# Patient Record
Sex: Male | Born: 1960 | Race: Black or African American | Hispanic: No | Marital: Single | Smoking: Current every day smoker
Health system: Southern US, Community
[De-identification: ages and names within clinical notes are randomized; demographics above are authoritative.]

## PROBLEM LIST (undated history)

## (undated) DIAGNOSIS — M1990 Unspecified osteoarthritis, unspecified site: Secondary | ICD-10-CM

## (undated) DIAGNOSIS — Z9109 Other allergy status, other than to drugs and biological substances: Secondary | ICD-10-CM

## (undated) DIAGNOSIS — M109 Gout, unspecified: Secondary | ICD-10-CM

## (undated) DIAGNOSIS — E7800 Pure hypercholesterolemia, unspecified: Secondary | ICD-10-CM

## (undated) DIAGNOSIS — I10 Essential (primary) hypertension: Secondary | ICD-10-CM

## (undated) HISTORY — PX: NECK SURGERY: SHX720

---

## 2014-08-04 DIAGNOSIS — I10 Essential (primary) hypertension: Secondary | ICD-10-CM | POA: Insufficient documentation

## 2016-06-05 ENCOUNTER — Ambulatory Visit: Payer: No Typology Code available for payment source | Attending: Chiropractic Medicine | Admitting: Physical Therapy

## 2017-03-17 ENCOUNTER — Emergency Department (HOSPITAL_BASED_OUTPATIENT_CLINIC_OR_DEPARTMENT_OTHER): Payer: Self-pay

## 2017-03-17 ENCOUNTER — Encounter (HOSPITAL_BASED_OUTPATIENT_CLINIC_OR_DEPARTMENT_OTHER): Payer: Self-pay | Admitting: Emergency Medicine

## 2017-03-17 ENCOUNTER — Emergency Department (HOSPITAL_BASED_OUTPATIENT_CLINIC_OR_DEPARTMENT_OTHER)
Admission: EM | Admit: 2017-03-17 | Discharge: 2017-03-17 | Discharge status: Against Medical Advice | Payer: Self-pay | Attending: Emergency Medicine | Admitting: Emergency Medicine

## 2017-03-17 ENCOUNTER — Other Ambulatory Visit: Payer: Self-pay

## 2017-03-17 DIAGNOSIS — R531 Weakness: Secondary | ICD-10-CM | POA: Insufficient documentation

## 2017-03-17 DIAGNOSIS — R202 Paresthesia of skin: Secondary | ICD-10-CM | POA: Insufficient documentation

## 2017-03-17 DIAGNOSIS — R935 Abnormal findings on diagnostic imaging of other abdominal regions, including retroperitoneum: Secondary | ICD-10-CM | POA: Insufficient documentation

## 2017-03-17 DIAGNOSIS — F17200 Nicotine dependence, unspecified, uncomplicated: Secondary | ICD-10-CM | POA: Insufficient documentation

## 2017-03-17 DIAGNOSIS — R42 Dizziness and giddiness: Secondary | ICD-10-CM | POA: Insufficient documentation

## 2017-03-17 DIAGNOSIS — R29898 Other symptoms and signs involving the musculoskeletal system: Secondary | ICD-10-CM

## 2017-03-17 DIAGNOSIS — R079 Chest pain, unspecified: Secondary | ICD-10-CM | POA: Insufficient documentation

## 2017-03-17 DIAGNOSIS — R0602 Shortness of breath: Secondary | ICD-10-CM | POA: Insufficient documentation

## 2017-03-17 DIAGNOSIS — I10 Essential (primary) hypertension: Secondary | ICD-10-CM | POA: Insufficient documentation

## 2017-03-17 HISTORY — DX: Other allergy status, other than to drugs and biological substances: Z91.09

## 2017-03-17 HISTORY — DX: Pure hypercholesterolemia, unspecified: E78.00

## 2017-03-17 HISTORY — DX: Essential (primary) hypertension: I10

## 2017-03-17 LAB — CBC
HEMATOCRIT: 44.7 % (ref 39.0–52.0)
Hemoglobin: 16.2 g/dL (ref 13.0–17.0)
MCH: 31.4 pg (ref 26.0–34.0)
MCHC: 36.2 g/dL — AB (ref 30.0–36.0)
MCV: 86.6 fL (ref 78.0–100.0)
Platelets: 231 10*3/uL (ref 150–400)
RBC: 5.16 MIL/uL (ref 4.22–5.81)
RDW: 12.8 % (ref 11.5–15.5)
WBC: 6.9 10*3/uL (ref 4.0–10.5)

## 2017-03-17 LAB — BASIC METABOLIC PANEL
Anion gap: 11 (ref 5–15)
BUN: 16 mg/dL (ref 6–20)
CALCIUM: 9.1 mg/dL (ref 8.9–10.3)
CO2: 24 mmol/L (ref 22–32)
CREATININE: 0.94 mg/dL (ref 0.61–1.24)
Chloride: 99 mmol/L — ABNORMAL LOW (ref 101–111)
GFR calc non Af Amer: >60 mL/min (ref >60)
Glucose, Bld: 116 mg/dL — ABNORMAL HIGH (ref 65–99)
Potassium: 3.5 mmol/L (ref 3.5–5.1)
Sodium: 134 mmol/L — ABNORMAL LOW (ref 135–145)

## 2017-03-17 LAB — URINALYSIS, ROUTINE W REFLEX MICROSCOPIC
Bilirubin Urine: NEGATIVE
GLUCOSE, UA: NEGATIVE mg/dL
HGB URINE DIPSTICK: NEGATIVE
Ketones, ur: NEGATIVE mg/dL
Leukocytes, UA: NEGATIVE
Nitrite: NEGATIVE
PH: 5.5 (ref 5.0–8.0)
Protein, ur: NEGATIVE mg/dL
SPECIFIC GRAVITY, URINE: 1.007 (ref 1.005–1.030)

## 2017-03-17 LAB — LIPASE, BLOOD: LIPASE: 22 U/L (ref 11–51)

## 2017-03-17 LAB — TROPONIN I: Troponin I: <0.03 ng/mL (ref <0.03)

## 2017-03-17 MED ORDER — IOPAMIDOL (ISOVUE-370) INJECTION 76%
100.0000 mL | Freq: Once | INTRAVENOUS | Status: AC | PRN
  Administered 2017-03-17: 100 mL via INTRAVENOUS

## 2017-03-17 NOTE — ED Triage Notes (Signed)
Chest pain for past 3-4 days with right arm numbness. Pain comes and goes,denies at present. Daily ETOH drinker and has recent death of nephew on the 03-17-2023. Lots of stress

## 2017-03-17 NOTE — ED Provider Notes (Signed)
[facility]   CSN: [id number] Arrival date & time: 03/17/17  1225     History   Chief Complaint Chief Complaint  Patient presents with  . Chest Pain    HPI Edwin Odonnell is a 56 y.o. male with history of hypertension, hypercholesterolemia, alcohol abuse who presents with a 3-4 day history of intermittent central, sharp chest pain with some associated shortness of breath and intermittent lightheadedness. Patient has also had right sided paresthesias to his right upper extremity. Patient denies any recent long trips, surgeries, cancer, history of blood clot. Patient does report an increased tightness, soreness to his left calf that has been present for the same amount of time as his other symptoms. Patient has not taken any medications at home for his symptoms. Patient recently had a death in the family and has been under a lot of stress over the past 5 days. Patient does not have any history of anxiety. Patient has history of hypertension, hypercholesterolemia. Patient reports that he drinks "a few 40s and more than a few shots of liquor" daily.  HPI  Past Medical History:  Diagnosis Date  . Environmental allergies   . High cholesterol   . High cholesterol   . Hypertension     There are no active problems to display for this patient.   Past Surgical History:  Procedure Laterality Date  . NECK SURGERY     ? gland removed from right side of neck at age 62        Home Medications    Prior to Admission medications   Not on File    Family History No family history on file.  Social History Social History  Substance Use Topics  . Smoking status: Current Every Day Smoker    Packs/day: 1.00  . Smokeless tobacco: Never Used  . Alcohol use Yes     Comment: daily     Allergies   Patient has no known allergies.   Review of Systems Review of Systems  Constitutional: Negative for chills and fever.  HENT: Negative for facial swelling and  sore throat.   Respiratory: Positive for shortness of breath.   Cardiovascular: Positive for chest pain.  Gastrointestinal: Negative for abdominal pain, nausea and vomiting.  Genitourinary: Negative for dysuria.  Musculoskeletal: Negative for back pain.  Skin: Negative for rash and wound.  Neurological: Positive for weakness (RUE), light-headedness and numbness (paresthesia to RUE). Negative for headaches.  Psychiatric/Behavioral: The patient is not nervous/anxious.      Physical Exam Updated Vital Signs BP (!) 141/89   Pulse 85   Temp 98.3 F (36.8 C) (Oral)   Resp 17   Ht  (1.854 m)   Wt 90.7 kg   SpO2 97%   BMI 26.39 kg/m   Physical Exam  Constitutional: He appears well-developed and well-nourished. No distress.  HENT:  Head: Normocephalic and atraumatic.  Mouth/Throat: Oropharynx is clear and moist. No oropharyngeal exudate.  Eyes: Conjunctivae are normal. Pupils are equal, round, and reactive to light. Right eye exhibits no discharge. Left eye exhibits no discharge. No scleral icterus.  Neck: Normal range of motion. Neck supple. No thyromegaly present.  Cardiovascular: Normal rate, regular rhythm, normal heart sounds and intact distal pulses.  Exam reveals no gallop and no friction rub.   No murmur heard. Pulmonary/Chest: Effort normal and breath sounds normal. No stridor. No respiratory distress. He has no wheezes. He has no rales.  Abdominal: Soft. Bowel sounds are normal. He exhibits  no distension. There is no tenderness. There is no rebound and no guarding.  Musculoskeletal: He exhibits no edema.  Right sided weakness with flexion and extension of upper extremity, notable decreased strength in the right side, radial pulses intact; 5/5 strength to lower extremity, mild right calf tenderness; DP pulses intact  Lymphadenopathy:    He has no cervical adenopathy.  Neurological: He is alert. Coordination normal.  Skin: Skin is warm and dry. No rash noted. He is not  diaphoretic. No pallor.  Psychiatric: He has a normal mood and affect.  Nursing note and vitals reviewed.    ED Treatments / Results  Labs (all labs ordered are listed, but only abnormal results are displayed) Labs Reviewed  CBC - Abnormal; Notable for the following:       Result Value   MCHC 36.2 (*)    All other components within normal limits  BASIC METABOLIC PANEL - Abnormal; Notable for the following:    Sodium 134 (*)    Chloride 99 (*)    Glucose, Bld 116 (*)    All other components within normal limits  TROPONIN I  LIPASE, BLOOD  URINALYSIS, ROUTINE W REFLEX MICROSCOPIC  TROPONIN I    EKG  EKG Interpretation  Date/Time:  Saturday March 17 2017 12:38:21 EDT Ventricular Rate:  102 PR Interval:  140 QRS Duration: 96 QT Interval:  362 QTC Calculation: 471 R Axis:   52 Text Interpretation:  Sinus tachycardia Otherwise normal ECG No old tracing to compare Confirmed by [facility]  MD, MARTHA [id number]) on 03/17/2017 1:30:05 PM       Radiology Ct Head Wo Contrast  Result Date: 03/17/2017 CLINICAL DATA:  Right upper extremity weakness and numbness 3-4 days. Chest pain. ETOH. EXAM: CT HEAD WITHOUT CONTRAST TECHNIQUE: Contiguous axial images were obtained from the base of the skull through the vertex without intravenous contrast. COMPARISON:  None. FINDINGS: Brain: Ventricles, cisterns and other CSF spaces are within normal. There is no mass, mass effect, shift of midline structures or acute hemorrhage. No evidence of acute infarction. Vascular: Within normal. Skull: Normal. Sinuses/Orbits: Normal. Other: None. IMPRESSION: No acute intracranial findings. Electronically Signed   By: Elberta Fortis M.D.   On: 03/17/2017 14:40   Ct Angio Chest Aorta W And/or Wo Contrast  Result Date: 03/17/2017 CLINICAL DATA:  Right upper extremity numbness and weakness 3- 4 days with chest pain. Hypertension. EXAM: CT ANGIOGRAPHY CHEST, ABDOMEN AND PELVIS TECHNIQUE: Multidetector CT imaging through the  chest, abdomen and pelvis was performed using the standard protocol during bolus administration of intravenous contrast. Multiplanar reconstructed images and MIPs were obtained and reviewed to evaluate the vascular anatomy. CONTRAST:  100 mL Isovue 370 IV. COMPARISON:  None. FINDINGS: CTA CHEST FINDINGS Cardiovascular: Heart is normal in size. Minimal calcified plaque over the proximal left anterior descending coronary artery. Thoracic aorta is normal in caliber without evidence of aneurysm or dissection. Common takeoff of the right brachiocephalic and left common carotid arteries from the arch. Remaining vascular structures are within normal. Mediastinum/Nodes: No evidence of mediastinal or hilar adenopathy. Remaining mediastinal structures are normal. Lungs/Pleura: Lungs are well inflated without consolidation or effusion. There is a 6 mm sub solid nodular density over the left apex as well as a 2-3 mm sub solid nodule over the right middle lobe. Airways are normal. Musculoskeletal: Within normal. Review of the MIP images confirms the above findings. CTA ABDOMEN AND PELVIS FINDINGS VASCULAR Aorta: Within normal without evidence of aneurysm or dissection. Celiac: Normal. SMA:  Normal. Renals: Normal. IMA: Normal. Inflow: Normal. Veins: Normal. Review of the MIP images confirms the above findings. NON-VASCULAR Hepatobiliary: Normal. Pancreas: Normal. Spleen: Normal. Adrenals/Urinary Tract: Adrenal glands are normal. Kidneys are normal in size without hydronephrosis or nephrolithiasis. Two small subcentimeter hypodensities over the upper pole left renal cortex too small to characterize but likely cysts. Ureters and bladder are normal. Stomach/Bowel: The stomach and small bowel are within normal. Appendix is normal. Colon is normal. Lymphatic: No adenopathy. Reproductive: Prominent prostate gland measuring 5.3 x 5.8 x 6.6 cm with prominent impression upon the bladder base. Other: No free fluid or focal inflammatory  change. Small umbilical hernia containing only peritoneal fat. Musculoskeletal: Within normal Review of the MIP images confirms the above findings. IMPRESSION: Normal thoracoabdominal aorta without evidence of aneurysm or dissection. No acute findings in the chest, abdomen or pelvis. Two small sub solid nodules with the larger over the left apex measuring 6 mm. Recommend followup CT 6 months. This recommendation follows the consensus statement: Guidelines for Management of Small Pulmonary Nodules Detected on CT Scans: A Statement from the Fleischner Society as published in Radiology 2005; 237:395-400. Online at: DietDisorder.cz. Subtle atherosclerotic coronary artery disease involving the left anterior descending coronary artery. Two small left upper pole renal cortical hypodensities likely cysts but too small to characterize. Mild prostatic enlargement measuring 5.3 x 5.8 x 6.6 cm with impression upon the bladder base. Small umbilical hernia containing only peritoneal fat. Electronically Signed   By: Elberta Fortis M.D.   On: 03/17/2017 15:01   Ct Angio Abd/pel W And/or Wo Contrast  Result Date: 03/17/2017 CLINICAL DATA:  Right upper extremity numbness and weakness 3- 4 days with chest pain. Hypertension. EXAM: CT ANGIOGRAPHY CHEST, ABDOMEN AND PELVIS TECHNIQUE: Multidetector CT imaging through the chest, abdomen and pelvis was performed using the standard protocol during bolus administration of intravenous contrast. Multiplanar reconstructed images and MIPs were obtained and reviewed to evaluate the vascular anatomy. CONTRAST:  100 mL Isovue 370 IV. COMPARISON:  None. FINDINGS: CTA CHEST FINDINGS Cardiovascular: Heart is normal in size. Minimal calcified plaque over the proximal left anterior descending coronary artery. Thoracic aorta is normal in caliber without evidence of aneurysm or dissection. Common takeoff of the right brachiocephalic and left common carotid arteries  from the arch. Remaining vascular structures are within normal. Mediastinum/Nodes: No evidence of mediastinal or hilar adenopathy. Remaining mediastinal structures are normal. Lungs/Pleura: Lungs are well inflated without consolidation or effusion. There is a 6 mm sub solid nodular density over the left apex as well as a 2-3 mm sub solid nodule over the right middle lobe. Airways are normal. Musculoskeletal: Within normal. Review of the MIP images confirms the above findings. CTA ABDOMEN AND PELVIS FINDINGS VASCULAR Aorta: Within normal without evidence of aneurysm or dissection. Celiac: Normal. SMA: Normal. Renals: Normal. IMA: Normal. Inflow: Normal. Veins: Normal. Review of the MIP images confirms the above findings. NON-VASCULAR Hepatobiliary: Normal. Pancreas: Normal. Spleen: Normal. Adrenals/Urinary Tract: Adrenal glands are normal. Kidneys are normal in size without hydronephrosis or nephrolithiasis. Two small subcentimeter hypodensities over the upper pole left renal cortex too small to characterize but likely cysts. Ureters and bladder are normal. Stomach/Bowel: The stomach and small bowel are within normal. Appendix is normal. Colon is normal. Lymphatic: No adenopathy. Reproductive: Prominent prostate gland measuring 5.3 x 5.8 x 6.6 cm with prominent impression upon the bladder base. Other: No free fluid or focal inflammatory change. Small umbilical hernia containing only peritoneal fat. Musculoskeletal: Within normal Review of the  MIP images confirms the above findings. IMPRESSION: Normal thoracoabdominal aorta without evidence of aneurysm or dissection. No acute findings in the chest, abdomen or pelvis. Two small sub solid nodules with the larger over the left apex measuring 6 mm. Recommend followup CT 6 months. This recommendation follows the consensus statement: Guidelines for Management of Small Pulmonary Nodules Detected on CT Scans: A Statement from the Fleischner Society as published in Radiology  2005; 237:395-400. Online at: DietDisorder.cz. Subtle atherosclerotic coronary artery disease involving the left anterior descending coronary artery. Two small left upper pole renal cortical hypodensities likely cysts but too small to characterize. Mild prostatic enlargement measuring 5.3 x 5.8 x 6.6 cm with impression upon the bladder base. Small umbilical hernia containing only peritoneal fat. Electronically Signed   By: Elberta Fortis M.D.   On: 03/17/2017 15:01    Procedures Procedures (including critical care time)  Medications Ordered in ED Medications  iopamidol (ISOVUE-370) 76 % injection 100 mL (100 mLs Intravenous Contrast Given 03/17/17 1404)     Initial Impression / Assessment and Plan / ED Course  I have reviewed the triage vital signs and the nursing notes.  Pertinent labs & imaging results that were available during my care of the patient were reviewed by me and considered in my medical decision making (see chart for details).     Patient left AGAINST MEDICAL ADVICE because he did not have time to stay any longer. CBC unremarkable. BMP shows sodium 134, chloride 99, glucose 116. Initial troponin negative. UA negative. EKG shows sinus tachycardia, but otherwise normal. CT dissection study showed normal aorta without evidence of aneurysm or dissection or other acute findings in the chest, abdomen, or pelvis. Patient also had 2 small sub-solid nodules in the lung recommending follow-up CT in 6 months, I wasn't able to make patient aware because he left AMA. Patient hard score 4. I recommended hospitalist admission or at the very least a delta troponin and consulting neurology considering patient's neurodeficit, however patient wanted to leave immediately. I advised patient to follow-up with cardiology and neurology, but patient did not wait for discharge paperwork. I made patient aware of the risks of leaving AGAINST MEDICAL ADVICE, including death.  He understands. Patient began to walk out with an IV in his arm, however he was stopped by nursing staff and had IV removed prior to discharge. I discussed patient case with Dr. Karma Ganja who guided the patient's management and agrees with plan.   Final Clinical Impressions(s) / ED Diagnoses   Final diagnoses:  Chest pain, unspecified type  Paresthesia of right arm  Right arm weakness    New Prescriptions There are no discharge medications for this patient.    Emi Holes, PA-C 03/17/17 1547    Jerelyn Scott, MD 03/17/17 [phone]

## 2017-03-17 NOTE — ED Notes (Signed)
Denies chest pain at present and numbness to right arm x 4 days . States that nephew just died and has been under a lot of stress

## 2017-03-17 NOTE — ED Notes (Signed)
Patient left AMA.  Patient was stopped walking out and stated that he was in a hurry and had to leave.  IV was removed and the patient left.

## 2017-03-17 NOTE — ED Notes (Signed)
RN starting IV and collecting blood work 

## 2019-02-11 ENCOUNTER — Other Ambulatory Visit: Payer: Self-pay

## 2019-02-11 ENCOUNTER — Encounter (HOSPITAL_BASED_OUTPATIENT_CLINIC_OR_DEPARTMENT_OTHER): Payer: Self-pay

## 2019-02-11 ENCOUNTER — Emergency Department (HOSPITAL_BASED_OUTPATIENT_CLINIC_OR_DEPARTMENT_OTHER)
Admission: EM | Admit: 2019-02-11 | Discharge: 2019-02-11 | Disposition: A | Discharge status: Home / Self Care | Payer: Self-pay | Attending: Emergency Medicine | Admitting: Emergency Medicine

## 2019-02-11 DIAGNOSIS — I10 Essential (primary) hypertension: Secondary | ICD-10-CM | POA: Insufficient documentation

## 2019-02-11 DIAGNOSIS — F17210 Nicotine dependence, cigarettes, uncomplicated: Secondary | ICD-10-CM | POA: Insufficient documentation

## 2019-02-11 HISTORY — DX: Unspecified osteoarthritis, unspecified site: M19.90

## 2019-02-11 LAB — URINALYSIS, ROUTINE W REFLEX MICROSCOPIC
BILIRUBIN URINE: NEGATIVE
Glucose, UA: NEGATIVE mg/dL
HGB URINE DIPSTICK: NEGATIVE
Ketones, ur: NEGATIVE mg/dL
Leukocytes,Ua: NEGATIVE
NITRITE: NEGATIVE
Protein, ur: NEGATIVE mg/dL
pH: 5 (ref 5.0–8.0)

## 2019-02-11 LAB — CBC WITH DIFFERENTIAL/PLATELET
Abs Immature Granulocytes: 0.03 10*3/uL (ref 0.00–0.07)
BASOS ABS: 0 10*3/uL (ref 0.0–0.1)
Basophils Relative: 0 %
Eosinophils Absolute: 0.1 10*3/uL (ref 0.0–0.5)
Eosinophils Relative: 1 %
HEMATOCRIT: 45.1 % (ref 39.0–52.0)
HEMOGLOBIN: 15.5 g/dL (ref 13.0–17.0)
Immature Granulocytes: 0 %
LYMPHS ABS: 1.6 10*3/uL (ref 0.7–4.0)
LYMPHS PCT: 22 %
MCH: 32.1 pg (ref 26.0–34.0)
MCHC: 34.4 g/dL (ref 30.0–36.0)
MCV: 93.4 fL (ref 80.0–100.0)
Monocytes Absolute: 0.7 10*3/uL (ref 0.1–1.0)
Monocytes Relative: 9 %
NEUTROS PCT: 68 %
NRBC: 0 % (ref 0.0–0.2)
Neutro Abs: 4.9 10*3/uL (ref 1.7–7.7)
Platelets: 236 10*3/uL (ref 150–400)
RBC: 4.83 MIL/uL (ref 4.22–5.81)
RDW: 12.7 % (ref 11.5–15.5)
WBC: 7.2 10*3/uL (ref 4.0–10.5)

## 2019-02-11 LAB — BASIC METABOLIC PANEL
ANION GAP: 8 (ref 5–15)
BUN: 17 mg/dL (ref 6–20)
CALCIUM: 9 mg/dL (ref 8.9–10.3)
CHLORIDE: 107 mmol/L (ref 98–111)
CO2: 21 mmol/L — AB (ref 22–32)
Creatinine, Ser: 0.96 mg/dL (ref 0.61–1.24)
GFR calc non Af Amer: >60 mL/min (ref >60)
Glucose, Bld: 88 mg/dL (ref 70–99)
Potassium: 4 mmol/L (ref 3.5–5.1)
SODIUM: 136 mmol/L (ref 135–145)

## 2019-02-11 LAB — TROPONIN I: Troponin I: <0.03 ng/mL (ref <0.03)

## 2019-02-11 MED ORDER — AMLODIPINE BESYLATE 5 MG PO TABS
5.0000 mg | ORAL_TABLET | Freq: Once | ORAL | Status: AC
  Administered 2019-02-11: 5 mg via ORAL
  Filled 2019-02-11: qty 1

## 2019-02-11 MED ORDER — AMLODIPINE BESYLATE 5 MG PO TABS: 5.0000 mg | ORAL_TABLET | Freq: Every day | ORAL | 0 refills | Status: DC

## 2019-02-11 NOTE — ED Triage Notes (Addendum)
C/o dizziness, blurred vision-pain to posterior neck and right shoulder x 3-4 days-pt NAD-steady gait

## 2019-02-11 NOTE — ED Provider Notes (Signed)
[facility] EMERGENCY DEPARTMENT Provider Note   CSN: [id number] Arrival date & time: 02/11/19  1247    History   Chief Complaint Chief Complaint  Patient presents with  . Dizziness    HPI Edwin Odonnell is a 58 y.o. male.     HPI   Date-year-old male, with a PMH of HTN, HLD, daily alcohol use, presents with multiple complaints.  Patient states he has had dizziness and blurred vision for the last 3 to 4 days.  Of note patient stopped taking his high blood pressure medication for the last week.  When asked why patient states "I was just going to follow-up with primary doctor on 3/17."  Patient states his blood pressure was very high today with a systolic in the 170s prior to arrival.  Patient denies any nausea, vomiting, chest pain, shortness of breath, abdominal pain.  Patient denies any fevers, chills, cough, rhinorrhea.  Past Medical History:  Diagnosis Date  . Arthritis   . Environmental allergies   . High cholesterol   . High cholesterol   . Hypertension     There are no active problems to display for this patient.   Past Surgical History:  Procedure Laterality Date  . NECK SURGERY     ? gland removed from right side of neck at age 59         Home Medications    Prior to Admission medications   Not on File    Family History No family history on file.  Social History Social History   Tobacco Use  . Smoking status: Current Every Day Smoker    Packs/day: 1.00  . Smokeless tobacco: Never Used  Substance Use Topics  . Alcohol use: Yes    Comment: daily  . Drug use: Not Currently    Types: Marijuana     Allergies   Patient has no known allergies.   Review of Systems Review of Systems  Constitutional: Negative for chills and fever.  HENT: Negative for rhinorrhea and sore throat.   Eyes: Positive for visual disturbance.  Respiratory: Negative for cough and shortness of breath.   Cardiovascular: Negative for chest pain and leg  swelling.  Gastrointestinal: Negative for abdominal pain, diarrhea, nausea and vomiting.  Genitourinary: Negative for dysuria, frequency and urgency.  Musculoskeletal: Negative for joint swelling and neck pain.  Skin: Negative for rash and wound.  Neurological: Positive for dizziness. Negative for syncope and numbness.  All other systems reviewed and are negative.    Physical Exam Updated Vital Signs BP (!) 149/95   Pulse 96   Temp 98.7 F (37.1 C) (Oral)   Resp 16   Ht 6' (1.829 m)   Wt 89.8 kg   SpO2 97%   BMI 26.85 kg/m   Physical Exam Vitals signs and nursing note reviewed.  Constitutional:      Appearance: He is well-developed.  HENT:     Head: Normocephalic and atraumatic.  Eyes:     Conjunctiva/sclera: Conjunctivae normal.  Neck:     Musculoskeletal: Neck supple.  Cardiovascular:     Rate and Rhythm: Normal rate and regular rhythm.     Heart sounds: Normal heart sounds. No murmur.  Pulmonary:     Effort: Pulmonary effort is normal. No respiratory distress.     Breath sounds: Normal breath sounds. No wheezing or rales.  Abdominal:     General: Bowel sounds are normal. There is no distension.     Palpations: Abdomen is soft.  Tenderness: There is no abdominal tenderness.  Musculoskeletal: Normal range of motion.        General: No tenderness or deformity.  Skin:    General: Skin is warm and dry.     Findings: No erythema or rash.  Neurological:     Mental Status: He is alert and oriented to person, place, and time.     Cranial Nerves: Cranial nerves are intact.     Sensory: Sensation is intact.     Motor: Motor function is intact.     Coordination: Coordination is intact.  Psychiatric:        Behavior: Behavior normal.      ED Treatments / Results  Labs (all labs ordered are listed, but only abnormal results are displayed) Labs Reviewed  BASIC METABOLIC PANEL - Abnormal; Notable for the following components:      Result Value   CO2 21 (*)     All other components within normal limits  CBC WITH DIFFERENTIAL/PLATELET  TROPONIN I  URINALYSIS, ROUTINE W REFLEX MICROSCOPIC    EKG EKG Interpretation  Date/Time:  Tuesday February 11 2019 12:58:10 EST Ventricular Rate:  109 PR Interval:  144 QRS Duration: 78 QT Interval:  342 QTC Calculation: 460 R Axis:   64 Text Interpretation:  Sinus tachycardia Otherwise normal ECG When compared to prior, sharper t waves.  No STEMI Confirmed by Theda Belfast ([id number]) on 02/11/2019 2:52:43 PM   Radiology No results found.  Procedures Procedures (including critical care time)  Medications Ordered in ED Medications  amLODipine (NORVASC) tablet 5 mg (5 mg Oral Given 02/11/19 1526)     Initial Impression / Assessment and Plan / ED Course  I have reviewed the triage vital signs and the nursing notes.  Pertinent labs & imaging results that were available during my care of the patient were reviewed by me and considered in my medical decision making (see chart for details).       1615 Resting comfortably in bed, no acute distress, nontoxic, non-lethargic.  Vital signs stable.  His blood pressure has come down after amlodipine which is his home medication.  He denies any symptoms at this time including dizziness and blurred vision.  Waiting on urine results.    Presented with dizziness and blurred vision.  He recently stopped his blood pressure medication.  He denies any adverse reaction to his blood pressure medication, he just opted because he was going to follow-up with his primary care doctor.  Patient noted a high blood pressure this morning with a systolic in the 170s.  He was given his home amlodipine and states his symptoms have completely resolved.  His blood work is unremarkable.  Urine shows no protein in the urine.  His neuro exam is nonfocal, nonlateralizing.  His vital signs are stable with improving blood pressure.  Recommend follow-up with his primary care doctor.  I will refill his  blood pressure medication today.  He was given strict return precautions and expressed understanding.  Patient ready and stable for discharge.   Final Clinical Impressions(s) / ED Diagnoses   Final diagnoses:  None    ED Discharge Orders    None       Rueben Bash 02/12/19 [id number]    Geoffery Lyons, MD 02/12/19 1505

## 2019-02-11 NOTE — Discharge Instructions (Signed)
Take your amlodipine as prescribed.  Follow-up with your primary care doctor as scheduled on 3/17.  Return to the ED immediately for new or worsening symptoms or concerns, such as chest pain, shortness of breath, fevers, vomiting, dizziness or any concerns at all.

## 2020-01-02 ENCOUNTER — Emergency Department (HOSPITAL_BASED_OUTPATIENT_CLINIC_OR_DEPARTMENT_OTHER): Payer: Self-pay

## 2020-01-02 ENCOUNTER — Emergency Department (HOSPITAL_BASED_OUTPATIENT_CLINIC_OR_DEPARTMENT_OTHER)
Admission: EM | Admit: 2020-01-02 | Discharge: 2020-01-03 | Disposition: A | Discharge status: Home / Self Care | Payer: Self-pay | Attending: Emergency Medicine | Admitting: Emergency Medicine

## 2020-01-02 ENCOUNTER — Other Ambulatory Visit: Payer: Self-pay

## 2020-01-02 ENCOUNTER — Encounter (HOSPITAL_BASED_OUTPATIENT_CLINIC_OR_DEPARTMENT_OTHER): Payer: Self-pay | Admitting: *Deleted

## 2020-01-02 DIAGNOSIS — R911 Solitary pulmonary nodule: Secondary | ICD-10-CM | POA: Insufficient documentation

## 2020-01-02 DIAGNOSIS — I10 Essential (primary) hypertension: Secondary | ICD-10-CM | POA: Insufficient documentation

## 2020-01-02 DIAGNOSIS — Z79899 Other long term (current) drug therapy: Secondary | ICD-10-CM | POA: Insufficient documentation

## 2020-01-02 DIAGNOSIS — R0602 Shortness of breath: Secondary | ICD-10-CM | POA: Insufficient documentation

## 2020-01-02 DIAGNOSIS — Z20822 Contact with and (suspected) exposure to covid-19: Secondary | ICD-10-CM | POA: Insufficient documentation

## 2020-01-02 DIAGNOSIS — R002 Palpitations: Secondary | ICD-10-CM | POA: Insufficient documentation

## 2020-01-02 DIAGNOSIS — F17210 Nicotine dependence, cigarettes, uncomplicated: Secondary | ICD-10-CM | POA: Insufficient documentation

## 2020-01-02 DIAGNOSIS — R079 Chest pain, unspecified: Secondary | ICD-10-CM | POA: Insufficient documentation

## 2020-01-02 LAB — CBC
HCT: 44.9 % (ref 39.0–52.0)
Hemoglobin: 15.8 g/dL (ref 13.0–17.0)
MCH: 30.9 pg (ref 26.0–34.0)
MCHC: 35.2 g/dL (ref 30.0–36.0)
MCV: 87.9 fL (ref 80.0–100.0)
Platelets: 295 10*3/uL (ref 150–400)
RBC: 5.11 MIL/uL (ref 4.22–5.81)
RDW: 12.3 % (ref 11.5–15.5)
WBC: 11.4 10*3/uL — ABNORMAL HIGH (ref 4.0–10.5)
nRBC: 0 % (ref 0.0–0.2)

## 2020-01-02 LAB — BASIC METABOLIC PANEL
Anion gap: 10 (ref 5–15)
BUN: 15 mg/dL (ref 6–20)
CO2: 24 mmol/L (ref 22–32)
Calcium: 9.3 mg/dL (ref 8.9–10.3)
Chloride: 101 mmol/L (ref 98–111)
Creatinine, Ser: 1.15 mg/dL (ref 0.61–1.24)
GFR calc Af Amer: >60 mL/min (ref >60)
GFR calc non Af Amer: >60 mL/min (ref >60)
Glucose, Bld: 96 mg/dL (ref 70–99)
Potassium: 3.3 mmol/L — ABNORMAL LOW (ref 3.5–5.1)
Sodium: 135 mmol/L (ref 135–145)

## 2020-01-02 LAB — SARS CORONAVIRUS 2 AG (30 MIN TAT): SARS Coronavirus 2 Ag: NEGATIVE

## 2020-01-02 LAB — TROPONIN I (HIGH SENSITIVITY)
Troponin I (High Sensitivity): 6 ng/L (ref <18)
Troponin I (High Sensitivity): 7 ng/L (ref <18)

## 2020-01-02 MED ORDER — IOHEXOL 350 MG/ML SOLN
100.0000 mL | Freq: Once | INTRAVENOUS | Status: AC | PRN
  Administered 2020-01-02: 100 mL via INTRAVENOUS

## 2020-01-02 MED ORDER — SODIUM CHLORIDE 0.9 % IV BOLUS
500.0000 mL | Freq: Once | INTRAVENOUS | Status: AC
  Administered 2020-01-02: 500 mL via INTRAVENOUS

## 2020-01-02 MED ORDER — SODIUM CHLORIDE 0.9% FLUSH
3.0000 mL | Freq: Once | INTRAVENOUS | Status: DC
  Filled 2020-01-02: qty 3

## 2020-01-02 NOTE — ED Provider Notes (Signed)
[facility] DEPARTMENT Provider Note   CSN: [id number] Arrival date & time: 01/02/20  1700     History Chief Complaint  Patient presents with  . Chest Pain    Edwin Odonnell is a 59 y.o. male with a history of hypertension, high cholesterol, smoking, presenting to the emergency department with chest pain and shortness of breath.  He reports he is a progressively worsening dyspnea and shortness of breath for several months.  He says that today around 4 PM he was watching the evening news and suddenly began to feel palpitations and shortness of breath and chest pain.  He said he took his pressure and it was 180/140, and that his heart rate was 140 at that time.  This lasted a couple minutes and has mostly dissipated, although he continues to have some mild chest pressure.  He denies any syncope.  He denies any history of DVT or PE. Denies hx of MI or cardiac stents.  Smokes 1 ppd.  HPI     Past Medical History:  Diagnosis Date  . Arthritis   . Environmental allergies   . High cholesterol   . High cholesterol   . Hypertension     There are no problems to display for this patient.   Past Surgical History:  Procedure Laterality Date  . NECK SURGERY     ? gland removed from right side of neck at age 28        No family history on file.  Social History   Tobacco Use  . Smoking status: Current Every Day Smoker    Packs/day: 1.00  . Smokeless tobacco: Never Used  Substance Use Topics  . Alcohol use: Yes    Comment: daily  . Drug use: Not Currently    Types: Marijuana    Home Medications Prior to Admission medications   Medication Sig Start Date End Date Taking? Authorizing Provider  allopurinol (ZYLOPRIM) 100 MG tablet Take 100 mg by mouth daily.   Yes [provider]  hydrochlorothiazide (HYDRODIURIL) 25 MG tablet Take 25 mg by mouth daily.   Yes [provider]  amLODipine (NORVASC) 5 MG tablet Take 1 tablet (5 mg total) by  mouth daily. Patient taking differently: Take 10 mg by mouth daily.  02/11/19   Kendrick, Cecilie Kicks, PA-C    Allergies    Patient has no known allergies.  Review of Systems   Review of Systems  Constitutional: Negative for chills and fever.  Respiratory: Positive for shortness of breath. Negative for cough.   Cardiovascular: Positive for chest pain and palpitations. Negative for leg swelling.  Gastrointestinal: Negative for abdominal pain and vomiting.  Genitourinary: Negative for dysuria and hematuria.  Skin: Negative for color change and rash.  Neurological: Negative for syncope and headaches.  All other systems reviewed and are negative.   Physical Exam Updated Vital Signs BP (!) 143/94 (BP Location: Right Arm)   Pulse 92   Temp 98.5 F (36.9 C) (Oral)   Resp 16   Ht 6\' 1"  (1.854 m)   Wt 91.6 kg   SpO2 100%   BMI 26.65 kg/m   Physical Exam Vitals and nursing note reviewed.  Constitutional:      Appearance: He is well-developed.  HENT:     Head: Normocephalic and atraumatic.  Eyes:     Conjunctiva/sclera: Conjunctivae normal.  Cardiovascular:     Rate and Rhythm: Normal rate and regular rhythm.     Heart sounds: No murmur.  Pulmonary:     Effort: Pulmonary effort is normal. No respiratory distress.     Breath sounds: Normal breath sounds.  Abdominal:     Palpations: Abdomen is soft.     Tenderness: There is no abdominal tenderness.  Musculoskeletal:     Cervical back: Neck supple.     Right lower leg: Edema present.     Left lower leg: Edema present.     Comments: Mild symmetrical bilateral pitting edema, compression socks on  Skin:    General: Skin is warm and dry.  Neurological:     General: No focal deficit present.     Mental Status: He is alert and oriented to person, place, and time.     ED Results / Procedures / Treatments   Labs (all labs ordered are listed, but only abnormal results are displayed) Labs Reviewed  BASIC METABOLIC PANEL -  Abnormal; Notable for the following components:      Result Value   Potassium 3.3 (*)    All other components within normal limits  CBC - Abnormal; Notable for the following components:   WBC 11.4 (*)    All other components within normal limits  SARS CORONAVIRUS 2 AG (30 MIN TAT)  TROPONIN I (HIGH SENSITIVITY)  TROPONIN I (HIGH SENSITIVITY)    EKG EKG Interpretation  Date/Time:  Friday January 02 2020 17:10:00 EST Ventricular Rate:  118 PR Interval:  152 QRS Duration: 94 QT Interval:  348 QTC Calculation: 487 R Axis:   14 Text Interpretation: Sinus tachycardia Abnormal ECG BEL V2 and V3 Borderline prolonged Qtc no STEMI Confirmed by Alvester Chou [phone]) on 01/02/2020 5:55:10 PM Also confirmed by Alvester Chou [phone]), editor Elita Quick [phone])  on 01/03/2020 9:32:13 AM   Radiology DG Chest 2 View  Result Date: 01/02/2020 CLINICAL DATA:  59 year old male with chest pain and shortness of breath EXAM: CHEST - 2 VIEW COMPARISON:  Chest radiograph dated 11/03/2019. FINDINGS: There is no focal consolidation, pleural effusion or pneumothorax. The cardiac silhouette is within limits. No acute osseous pathology. IMPRESSION: No active cardiopulmonary disease. Electronically Signed   By: Elgie Collard M.D.   On: 01/02/2020 17:35   CT Angio Chest PE W and/or Wo Contrast  Result Date: 01/02/2020 CLINICAL DATA:  60 year old male with chest pain and shortness of breath. EXAM: CT ANGIOGRAPHY CHEST WITH CONTRAST TECHNIQUE: Multidetector CT imaging of the chest was performed using the standard protocol during bolus administration of intravenous contrast. Multiplanar CT image reconstructions and MIPs were obtained to evaluate the vascular anatomy. CONTRAST:  OMNIPAQUE IOHEXOL 350 MG/ML SOLN COMPARISON:  Chest radiograph dated 01/02/2020. CT of the chest abdomen pelvis dated 03/17/2017. FINDINGS: Cardiovascular: There is no cardiomegaly or pericardial effusion. The thoracic aorta is  unremarkable. The origins of the great vessels of the aortic arch appear patent as visualized. No pulmonary artery embolus identified. Mediastinum/Nodes: There is no hilar or mediastinal adenopathy. The esophagus and the thyroid gland are grossly unremarkable. No mediastinal fluid collection or hematoma. Lungs/Pleura: There is a 6 mm ground-glass nodule in the left apex (series 5, image 18). The lungs are otherwise clear. There is no pleural effusion or pneumothorax. The central airways are patent. Upper Abdomen: No acute abnormality. Musculoskeletal: No chest wall abnormality. No acute or significant osseous findings. Review of the MIP images confirms the above findings. IMPRESSION: 1. No acute intrathoracic pathology. No CT evidence of pulmonary embolism. 2. A 6 mm left apical ground-glass nodule. Initial follow-up with CT at 6-12 months is  recommended to confirm persistence. If persistent, repeat CT is recommended every 2 years until 5 years of stability has been established. This recommendation follows the consensus statement: Guidelines for Management of Incidental Pulmonary Nodules Detected on CT Images: From the Fleischner Society 2017; Radiology 2017; 284:228-243. Electronically Signed   By: Elgie Collard M.D.   On: 01/02/2020 23:28    Procedures Procedures (including critical care time)  Medications Ordered in ED Medications  sodium chloride 0.9 % bolus 500 mL ( Intravenous Stopped 01/02/20 2124)  iohexol (OMNIPAQUE) 350 MG/ML injection 100 mL (100 mLs Intravenous Contrast Given 01/02/20 2307)    ED Course  I have reviewed the triage vital signs and the nursing notes.  Pertinent labs & imaging results that were available during my care of the patient were reviewed by me and considered in my medical decision making (see chart for details).  59 yo male presenting to the ED with chest pressure and SOB beginning around 4 pm today.    DDx includes ACS vs. PE vs. Viral illness vs PNA vs PTX vs  other  Serial delta troponins negative, acceptable within the range of his symptom onset COVID negative CT PE with pulmonary nodule (discussed with patient), no PE, no sign of PTX or bacterial PNA Low suspicion for aortic dissection clinically  No STEMI on ecg  Based on his cardiac risk factors, however, I made a strong recommendation that he follow up with a cardiologist as an outpatient, and advised that he call them to set up an appointment in the next week.  He verbalized understanding.  Clinical Course as of Jan 02 1210  Caleen Essex Jan 02, 2020  2336 IMPRESSION: 1. No acute intrathoracic pathology. No CT evidence of pulmonary embolism. 2. A 6 mm left apical ground-glass nodule. Initial follow-up with CT at 6-12 months is recommended to confirm persistence. If persistent, repeat CT is recommended every 2 years until 5 years of stability has been established. This recommendation follows the consensus statement: Guidelines for Management of Incidental Pulmonary Nodules Detected on CT Images: From the Fleischner Society 2017; Radiology 2017; 284:228-243.   [MT]    Clinical Course User Index [MT] Mckinzee Spirito, Kermit Balo, MD    Final Clinical Impression(s) / ED Diagnoses Final diagnoses:  Chest pain, unspecified type  Nodule of upper lobe of left lung    Rx / DC Orders ED Discharge Orders    None       Terald Sleeper, MD 01/03/20 1211

## 2020-01-02 NOTE — ED Triage Notes (Signed)
He has had dizziness since this am. Chest pain, he feels SOB.

## 2020-01-02 NOTE — Discharge Instructions (Addendum)
You have a 6 mm nodule, or mass, in the top of your left lung.  It is not clear what this is, but it may be pre-cancerous.  Your primary care doctor should get another CT scan of your lungs in 6 to 12 months to follow-up on this.  I also strongly advised that you see a cardiologist or heart doctor in the next 1 to 2 weeks.  You may need more of a work-up for possible heart disease.  Please make every effort to quit smoking.  Smoking raises the risk of both heart disease and lung cancer.

## 2020-09-20 IMAGING — CT CT ANGIO CHEST
3 of 8 series · 13 of 36 positions shown · IV contrast (Omnipaque)
Comparison: Chest radiograph dated 01/02/2020. CT of the chest
abdomen pelvis dated 03/17/2017.

CLINICAL DATA: 58-year-old male with chest pain and shortness of
breath.

EXAM:
CT ANGIOGRAPHY CHEST WITH CONTRAST
TECHNIQUE: Multidetector CT imaging of the chest was performed using the
standard protocol during bolus administration of intravenous
contrast. Multiplanar CT image reconstructions and MIPs were
obtained to evaluate the vascular anatomy.
CONTRAST:  100mL OMNIPAQUE IOHEXOL 350 MG/ML SOLN

[Series 4: pe axial st · axial · 0.70mm/px · z∈[-266,-104]mm · 4 of 90 slices shown]
[im 18/90  lung]
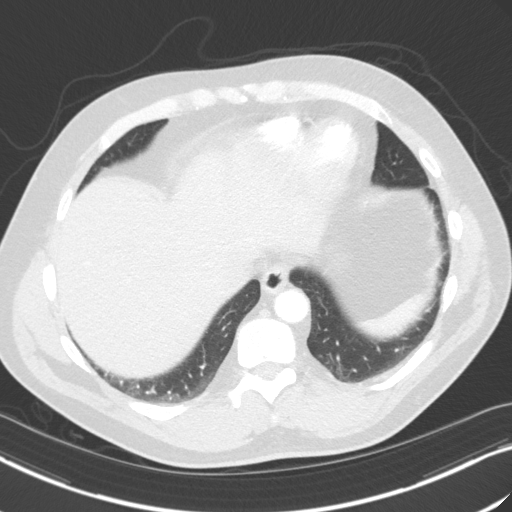
[im 36/90  mediastinal]
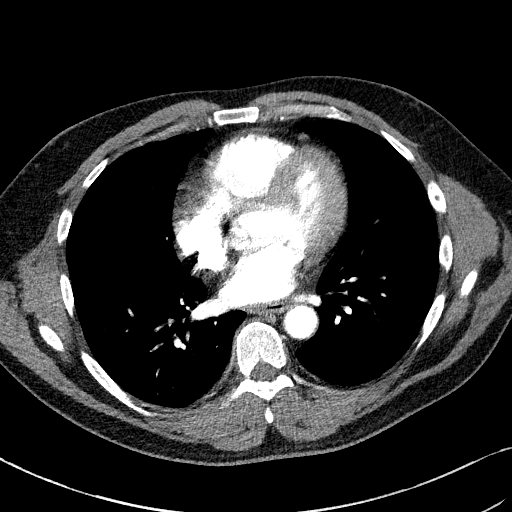
[im 54/90  lung]
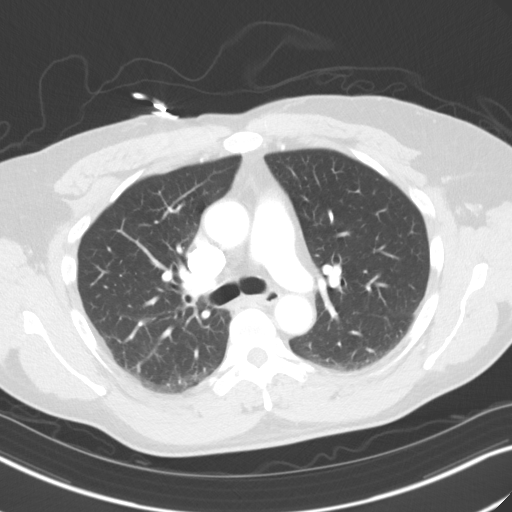
[im 72/90  mediastinal]
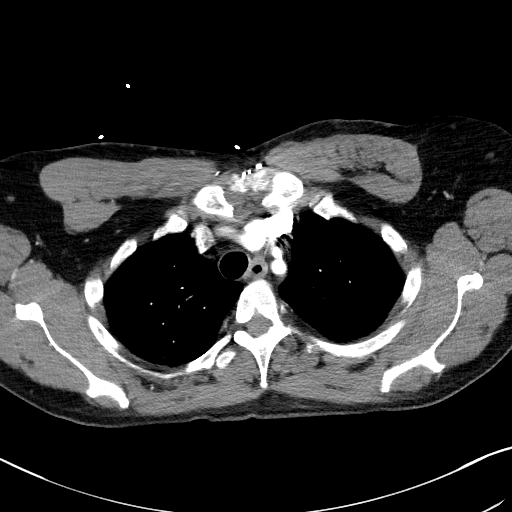

[Series 6: pe coronal mpr · coronal · 0.55mm/px · 1 of 123 slices shown]
[im 62/123  mediastinal]
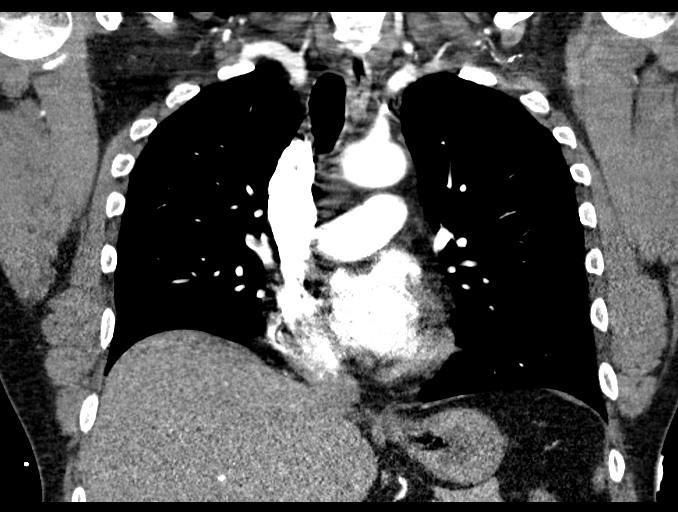

[Series 9: pe thins · axial · 0.70mm/px · z∈[-277,-77]mm · 8 of 254 slices shown]
[im 27/254  lung]
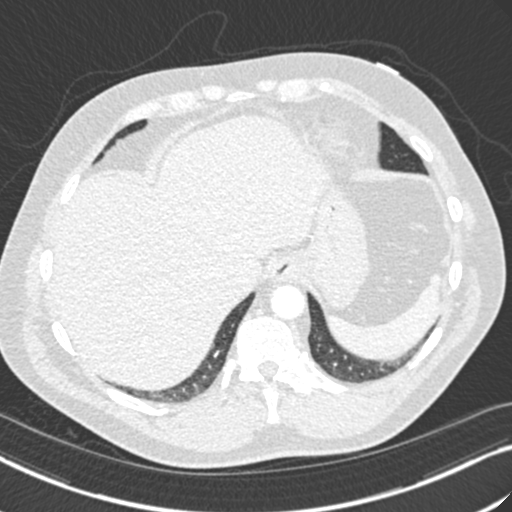
[im 54/254  lung]
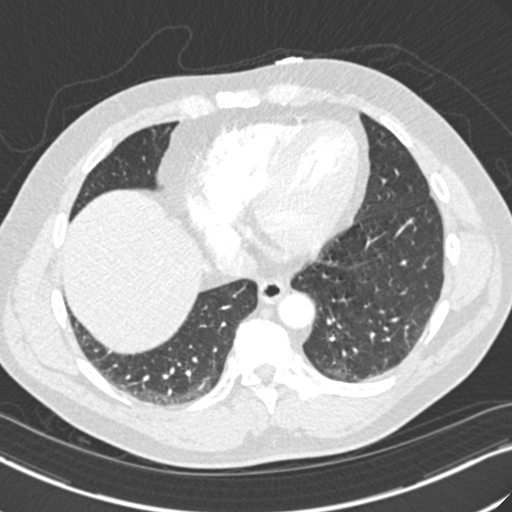
[im 80/254  lung]
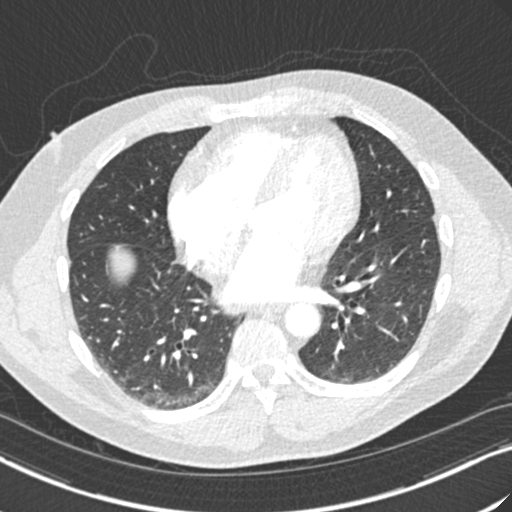
[im 107/254  lung]
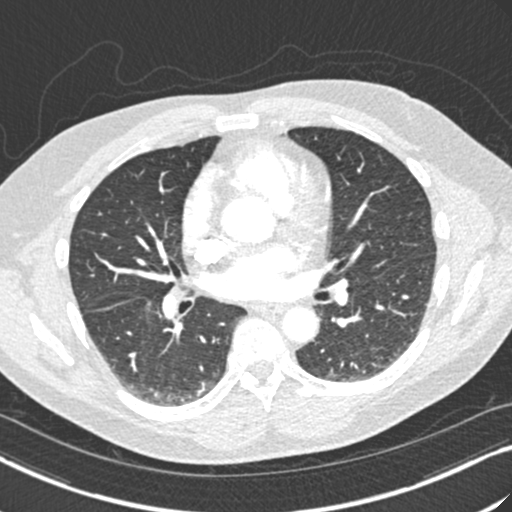
[im 147/254  lung]
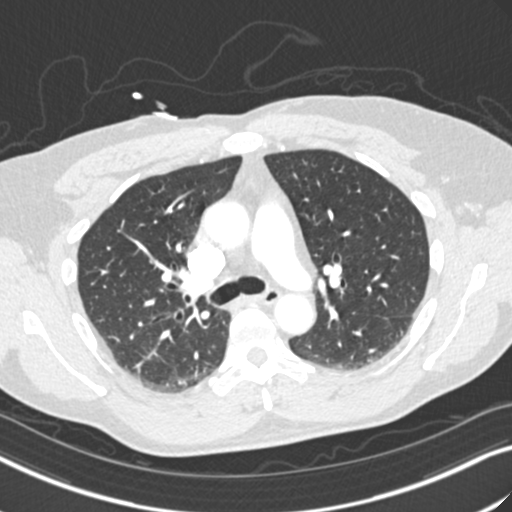
[im 174/254  lung]
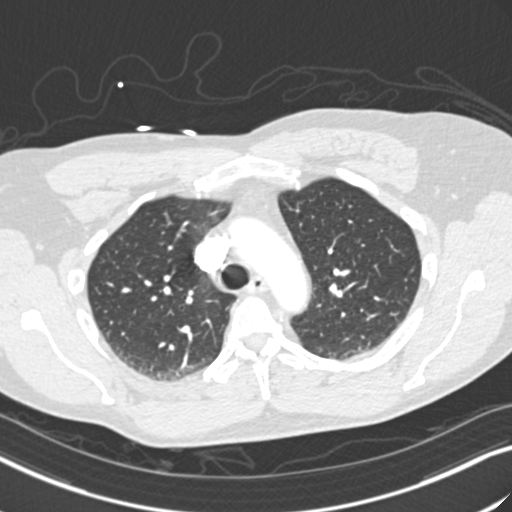
[im 200/254  lung]
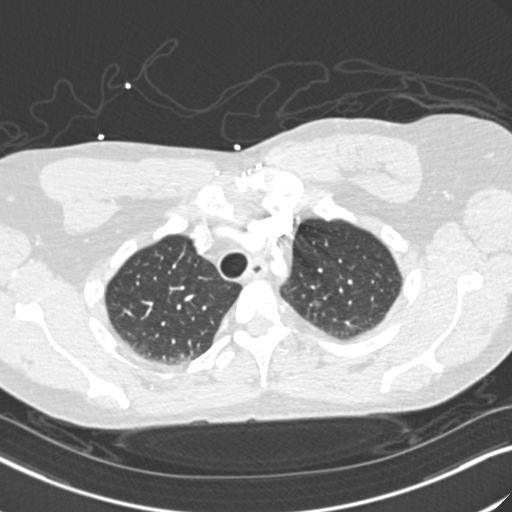
[im 227/254  lung]
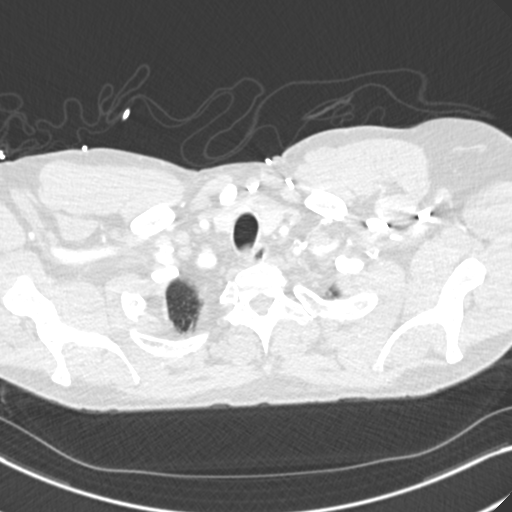

[13 of 36 positions shown; findings below may reference images not displayed]

FINDINGS: Cardiovascular: There is no cardiomegaly or pericardial effusion.
The thoracic aorta is unremarkable. The origins of the great vessels
of the aortic arch appear patent as visualized. No pulmonary artery
embolus identified.

Mediastinum/Nodes: There is no hilar or mediastinal adenopathy. The
esophagus and the thyroid gland are grossly unremarkable. No
mediastinal fluid collection or hematoma.

Lungs/Pleura: There is a 6 mm ground-glass nodule in the left apex
(series 5, image 18). The lungs are otherwise clear. There is no
pleural effusion or pneumothorax. The central airways are patent.

Upper Abdomen: No acute abnormality.

Musculoskeletal: No chest wall abnormality. No acute or significant
osseous findings.

Review of the MIP images confirms the above findings.
IMPRESSION: 1. No acute intrathoracic pathology. No CT evidence of pulmonary
embolism.
2. A 6 mm left apical ground-glass nodule. Initial follow-up with CT
at 6-12 months is recommended to confirm persistence. If persistent,
repeat CT is recommended every 2 years until 5 years of stability
has been established. This recommendation follows the consensus
statement: Guidelines for Management of Incidental Pulmonary Nodules
Detected on CT Images: From the [HOSPITAL] 1643; Radiology

## 2020-11-01 ENCOUNTER — Encounter (HOSPITAL_BASED_OUTPATIENT_CLINIC_OR_DEPARTMENT_OTHER): Payer: Self-pay | Admitting: Emergency Medicine

## 2020-11-01 ENCOUNTER — Emergency Department (HOSPITAL_BASED_OUTPATIENT_CLINIC_OR_DEPARTMENT_OTHER)
Admission: EM | Admit: 2020-11-01 | Discharge: 2020-11-01 | Disposition: A | Discharge status: Home / Self Care | Payer: Self-pay | Attending: Emergency Medicine | Admitting: Emergency Medicine

## 2020-11-01 ENCOUNTER — Other Ambulatory Visit: Payer: Self-pay

## 2020-11-01 DIAGNOSIS — E876 Hypokalemia: Secondary | ICD-10-CM | POA: Insufficient documentation

## 2020-11-01 DIAGNOSIS — E860 Dehydration: Secondary | ICD-10-CM

## 2020-11-01 DIAGNOSIS — N179 Acute kidney failure, unspecified: Secondary | ICD-10-CM | POA: Insufficient documentation

## 2020-11-01 DIAGNOSIS — I10 Essential (primary) hypertension: Secondary | ICD-10-CM | POA: Insufficient documentation

## 2020-11-01 DIAGNOSIS — F17200 Nicotine dependence, unspecified, uncomplicated: Secondary | ICD-10-CM | POA: Insufficient documentation

## 2020-11-01 DIAGNOSIS — E60 Dietary zinc deficiency: Secondary | ICD-10-CM | POA: Insufficient documentation

## 2020-11-01 DIAGNOSIS — R55 Syncope and collapse: Secondary | ICD-10-CM | POA: Insufficient documentation

## 2020-11-01 DIAGNOSIS — Z79899 Other long term (current) drug therapy: Secondary | ICD-10-CM | POA: Insufficient documentation

## 2020-11-01 HISTORY — DX: Gout, unspecified: M10.9

## 2020-11-01 LAB — COMPREHENSIVE METABOLIC PANEL
ALT: 41 U/L (ref 0–44)
AST: 48 U/L — ABNORMAL HIGH (ref 15–41)
Albumin: 3.6 g/dL (ref 3.5–5.0)
Alkaline Phosphatase: 72 U/L (ref 38–126)
Anion gap: 12 (ref 5–15)
BUN: 19 mg/dL (ref 6–20)
CO2: 22 mmol/L (ref 22–32)
Calcium: 8.3 mg/dL — ABNORMAL LOW (ref 8.9–10.3)
Chloride: 100 mmol/L (ref 98–111)
Creatinine, Ser: 1.82 mg/dL — ABNORMAL HIGH (ref 0.61–1.24)
GFR, Estimated: 42 mL/min — ABNORMAL LOW (ref >60)
Glucose, Bld: 113 mg/dL — ABNORMAL HIGH (ref 70–99)
Potassium: 3.1 mmol/L — ABNORMAL LOW (ref 3.5–5.1)
Sodium: 134 mmol/L — ABNORMAL LOW (ref 135–145)
Total Bilirubin: 0.6 mg/dL (ref 0.3–1.2)
Total Protein: 6.8 g/dL (ref 6.5–8.1)

## 2020-11-01 LAB — CBC WITH DIFFERENTIAL/PLATELET
Abs Immature Granulocytes: 0.06 10*3/uL (ref 0.00–0.07)
Basophils Absolute: 0 10*3/uL (ref 0.0–0.1)
Basophils Relative: 0 %
Eosinophils Absolute: 0 10*3/uL (ref 0.0–0.5)
Eosinophils Relative: 0 %
HCT: 43 % (ref 39.0–52.0)
Hemoglobin: 15.1 g/dL (ref 13.0–17.0)
Immature Granulocytes: 1 %
Lymphocytes Relative: 5 %
Lymphs Abs: 0.5 10*3/uL — ABNORMAL LOW (ref 0.7–4.0)
MCH: 32.8 pg (ref 26.0–34.0)
MCHC: 35.1 g/dL (ref 30.0–36.0)
MCV: 93.5 fL (ref 80.0–100.0)
Monocytes Absolute: 0.9 10*3/uL (ref 0.1–1.0)
Monocytes Relative: 10 %
Neutro Abs: 7.5 10*3/uL (ref 1.7–7.7)
Neutrophils Relative %: 84 %
Platelets: 194 10*3/uL (ref 150–400)
RBC: 4.6 MIL/uL (ref 4.22–5.81)
RDW: 13.6 % (ref 11.5–15.5)
WBC: 9 10*3/uL (ref 4.0–10.5)
nRBC: 0 % (ref 0.0–0.2)

## 2020-11-01 LAB — RAPID URINE DRUG SCREEN, HOSP PERFORMED
Amphetamines: NOT DETECTED
Barbiturates: NOT DETECTED
Benzodiazepines: NOT DETECTED
Cocaine: POSITIVE — AB
Opiates: NOT DETECTED
Tetrahydrocannabinol: POSITIVE — AB

## 2020-11-01 LAB — TROPONIN I (HIGH SENSITIVITY)
Troponin I (High Sensitivity): 6 ng/L (ref <18)
Troponin I (High Sensitivity): 5 ng/L (ref <18)

## 2020-11-01 LAB — ETHANOL: Alcohol, Ethyl (B): <10 mg/dL (ref <10)

## 2020-11-01 MED ORDER — SODIUM CHLORIDE 0.9 % IV BOLUS
500.0000 mL | Freq: Once | INTRAVENOUS | Status: AC
  Administered 2020-11-01: 500 mL via INTRAVENOUS

## 2020-11-01 MED ORDER — LACTATED RINGERS IV BOLUS
1000.0000 mL | Freq: Once | INTRAVENOUS | Status: AC
  Administered 2020-11-01: 1000 mL via INTRAVENOUS

## 2020-11-01 NOTE — ED Notes (Signed)
Pt on monitor and vitals cycling 

## 2020-11-01 NOTE — ED Triage Notes (Signed)
Per EMS:  Pt was having cold symptoms last week.  Pt at work,  Had 2 syncopal episodes.  Positive dizziness.  Pt was caught x 2 by co-workers, so no head injury.  Some changes in HR with legs elevated.

## 2020-11-01 NOTE — Discharge Instructions (Signed)
Your kidney function looks worse.  Hold the hydrochlorothiazide for the next week.  Continue the potassium supplementation.  Try and keep yourself hydrated.  Follow-up with your doctor for further evaluation of your kidney function and potassium.

## 2020-11-01 NOTE — ED Provider Notes (Signed)
[facility] EMERGENCY DEPARTMENT Provider Note   CSN: [id number] Arrival date & time: 11/01/20  1046     History Chief Complaint  Patient presents with  . Loss of Consciousness    Edwin Odonnell is a 59 y.o. male.  HPI Patient presents after a near syncopal episode and a syncopal episode at work.  States he has been feeling bad for the last few days.  Got seen at [facility] couple days ago.  Had chest pain at time.  On Thursday had negative Covid test and states Monday.  States he has been eating and drinking okay but been feeling bad.  States he was actually going to the nurse at work to state that he was can have to go home.  He then felt worse and passed out.  No chest pain.  No nausea or vomiting.  No head injury.  States that he has not had an alcoholic drink in a couple days also.  States that he was drinking about a sixpack of beer a day before that.  States he has been a little tremulous.  No loss of bladder or bowel control.    Past Medical History:  Diagnosis Date  . Arthritis   . Environmental allergies   . Gout   . High cholesterol   . Hypertension     There are no problems to display for this patient.   Past Surgical History:  Procedure Laterality Date  . NECK SURGERY     ? gland removed from right side of neck at age 35        History reviewed. No pertinent family history.  Social History   Tobacco Use  . Smoking status: Current Every Day Smoker    Packs/day: 1.00  . Smokeless tobacco: Never Used  Vaping Use  . Vaping Use: Never used  Substance Use Topics  . Alcohol use: Yes    Comment: occasionally  . Drug use: Not Currently    Types: Marijuana    Home Medications Prior to Admission medications   Medication Sig Start Date End Date Taking? Authorizing Provider  potassium chloride (KLOR-CON) 10 MEQ tablet Take by mouth. 10/28/20 11/04/20 Yes [provider]  allopurinol (ZYLOPRIM) 100 MG tablet Take 100 mg by mouth  daily.    [provider]  amLODipine (NORVASC) 10 MG tablet TAKE 1 Tablet BY MOUTH ONCE EVERY DAY 10/13/20   [provider]  hydrochlorothiazide (HYDRODIURIL) 25 MG tablet Take 25 mg by mouth daily.    [provider]  losartan (COZAAR) 100 MG tablet Take 100 mg by mouth daily. 10/13/20   [provider]  meloxicam (MOBIC) 15 MG tablet Take 15 mg by mouth daily as needed. 10/13/20   [provider]  PROZAC 10 MG capsule Take 10 mg by mouth 3 (three) times daily. 10/13/20   [provider]  traZODone (DESYREL) 100 MG tablet Take 100 mg by mouth daily. 10/13/20   [provider]    Allergies    Patient has no known allergies.  Review of Systems   Review of Systems  Constitutional: Positive for fatigue. Negative for appetite change.  HENT: Negative for congestion.   Eyes: Negative for visual disturbance.  Respiratory: Negative for shortness of breath.   Cardiovascular: Negative for chest pain.  Gastrointestinal: Negative for nausea and vomiting.  Endocrine: Negative for polyuria.  Genitourinary: Negative for flank pain.  Musculoskeletal: Negative for back pain.  Skin: Negative for rash.  Neurological: Positive for  tremors, syncope and light-headedness.  Psychiatric/Behavioral: Negative for confusion.    Physical Exam Updated Vital Signs BP 130/87 (BP Location: Right Arm)   Pulse 92   Temp 98.6 F (37 C) (Oral)   Resp 19   Ht 6\' 1"  (1.854 m)   Wt 95.3 kg   SpO2 99%   BMI 27.71 kg/m   Physical Exam Nursing note reviewed.  Constitutional:      Appearance: Normal appearance.  HENT:     Head: Atraumatic.     Mouth/Throat:     Mouth: Mucous membranes are moist.  Eyes:     Extraocular Movements: Extraocular movements intact.  Cardiovascular:     Rate and Rhythm: Regular rhythm.  Pulmonary:     Effort: Pulmonary effort is normal.     Breath sounds: Normal breath sounds.  Abdominal:     Tenderness: There is no  abdominal tenderness.  Musculoskeletal:        General: No tenderness.     Cervical back: Neck supple.     Right lower leg: No edema.     Left lower leg: No edema.     Comments: Neoprene wrap on left hand.  No tremor.  Skin:    General: Skin is warm.     Capillary Refill: Capillary refill takes less than 2 seconds.  Neurological:     Mental Status: He is alert and oriented to person, place, and time.     Comments: No tremor in upper extremities.  Psychiatric:        Mood and Affect: Mood normal.     ED Results / Procedures / Treatments   Labs (all labs ordered are listed, but only abnormal results are displayed) Labs Reviewed  COMPREHENSIVE METABOLIC PANEL - Abnormal; Notable for the following components:      Result Value   Sodium 134 (*)    Potassium 3.1 (*)    Glucose, Bld 113 (*)    Creatinine, Ser 1.82 (*)    Calcium 8.3 (*)    AST 48 (*)    GFR, Estimated 42 (*)    All other components within normal limits  CBC WITH DIFFERENTIAL/PLATELET - Abnormal; Notable for the following components:   Lymphs Abs 0.5 (*)    All other components within normal limits  RAPID URINE DRUG SCREEN, HOSP PERFORMED - Abnormal; Notable for the following components:   Cocaine POSITIVE (*)    Tetrahydrocannabinol POSITIVE (*)    All other components within normal limits  ETHANOL  TROPONIN I (HIGH SENSITIVITY)  TROPONIN I (HIGH SENSITIVITY)    EKG EKG Interpretation  Date/Time:  Monday November 01 2020 10:56:40 EST Ventricular Rate:  101 PR Interval:    QRS Duration: 92 QT Interval:  380 QTC Calculation: 493 R Axis:   32 Text Interpretation: Sinus tachycardia Borderline ST elevation, anterior leads Borderline prolonged QT interval  No stemi Confirmed by [id number] [phone]) on 11/01/2020 11:03:11 AM   Radiology No results found.  Procedures Procedures (including critical care time)  Medications Ordered in ED Medications  sodium chloride 0.9 % bolus 500 mL (0 mLs  Intravenous Stopped 11/01/20 1254)  lactated ringers bolus 1,000 mL (0 mLs Intravenous Stopped 11/01/20 1423)    ED Course  I have reviewed the triage vital signs and the nursing notes.  Pertinent labs & imaging results that were available during my care of the patient were reviewed by me and considered in my medical decision making (see chart for details).    MDM Rules/Calculators/A&P  Patient presents after syncopal episode at work.  Passed out.  Was somewhat orthostatic upon arrival.  Feels better after IV fluids.  Troponin negative.  EKG reassuring.  However creatinine has increased.  I think likely is dehydration.  Also has been drinking less after recent heavy drinking.  Creatinine increased to 1.8 from 1 a few days ago.  However patient feels better.  Also mild hyperkalemia.  Patient feels and looks better after the fluid bolus.  I think is stable for discharge.  Outpatient follow-up as needed.  Doubt severe arrhythmia or seizure as the cause of the syncope. Final Clinical Impression(s) / ED Diagnoses Final diagnoses:  Syncope, unspecified syncope type  Dehydration  AKI (acute kidney injury) (HCC)  Hypokalemia    Rx / DC Orders ED Discharge Orders    None       Benjiman Core, MD 11/01/20 1513

## 2021-10-11 ENCOUNTER — Emergency Department (HOSPITAL_BASED_OUTPATIENT_CLINIC_OR_DEPARTMENT_OTHER): Payer: BC Managed Care – PPO

## 2021-10-11 ENCOUNTER — Emergency Department (HOSPITAL_BASED_OUTPATIENT_CLINIC_OR_DEPARTMENT_OTHER)
Admission: EM | Admit: 2021-10-11 | Discharge: 2021-10-11 | Disposition: A | Discharge status: Home / Self Care | Payer: BC Managed Care – PPO | Attending: Emergency Medicine | Admitting: Emergency Medicine

## 2021-10-11 ENCOUNTER — Other Ambulatory Visit: Payer: Self-pay

## 2021-10-11 ENCOUNTER — Encounter (HOSPITAL_BASED_OUTPATIENT_CLINIC_OR_DEPARTMENT_OTHER): Payer: Self-pay | Admitting: *Deleted

## 2021-10-11 DIAGNOSIS — K469 Unspecified abdominal hernia without obstruction or gangrene: Secondary | ICD-10-CM | POA: Diagnosis not present

## 2021-10-11 DIAGNOSIS — F17210 Nicotine dependence, cigarettes, uncomplicated: Secondary | ICD-10-CM | POA: Insufficient documentation

## 2021-10-11 DIAGNOSIS — Z20822 Contact with and (suspected) exposure to covid-19: Secondary | ICD-10-CM | POA: Diagnosis not present

## 2021-10-11 DIAGNOSIS — Z79899 Other long term (current) drug therapy: Secondary | ICD-10-CM | POA: Diagnosis not present

## 2021-10-11 DIAGNOSIS — I10 Essential (primary) hypertension: Secondary | ICD-10-CM | POA: Insufficient documentation

## 2021-10-11 DIAGNOSIS — E860 Dehydration: Secondary | ICD-10-CM | POA: Insufficient documentation

## 2021-10-11 DIAGNOSIS — R059 Cough, unspecified: Secondary | ICD-10-CM | POA: Diagnosis present

## 2021-10-11 DIAGNOSIS — R000 Tachycardia, unspecified: Secondary | ICD-10-CM | POA: Insufficient documentation

## 2021-10-11 DIAGNOSIS — B349 Viral infection, unspecified: Secondary | ICD-10-CM | POA: Diagnosis not present

## 2021-10-11 LAB — CBC WITH DIFFERENTIAL/PLATELET
Abs Immature Granulocytes: 0.1 10*3/uL — ABNORMAL HIGH (ref 0.00–0.07)
Basophils Absolute: 0 10*3/uL (ref 0.0–0.1)
Basophils Relative: 0 %
Eosinophils Absolute: 0 10*3/uL (ref 0.0–0.5)
Eosinophils Relative: 0 %
HCT: 41.6 % (ref 39.0–52.0)
Hemoglobin: 14.6 g/dL (ref 13.0–17.0)
Immature Granulocytes: 1 %
Lymphocytes Relative: 13 %
Lymphs Abs: 1.2 10*3/uL (ref 0.7–4.0)
MCH: 32.1 pg (ref 26.0–34.0)
MCHC: 35.1 g/dL (ref 30.0–36.0)
MCV: 91.4 fL (ref 80.0–100.0)
Monocytes Absolute: 0.9 10*3/uL (ref 0.1–1.0)
Monocytes Relative: 10 %
Neutro Abs: 6.9 10*3/uL (ref 1.7–7.7)
Neutrophils Relative %: 76 %
Platelets: 265 10*3/uL (ref 150–400)
RBC: 4.55 MIL/uL (ref 4.22–5.81)
RDW: 12.7 % (ref 11.5–15.5)
WBC: 9.1 10*3/uL (ref 4.0–10.5)
nRBC: 0 % (ref 0.0–0.2)

## 2021-10-11 LAB — COMPREHENSIVE METABOLIC PANEL
ALT: 21 U/L (ref 0–44)
AST: 20 U/L (ref 15–41)
Albumin: 3.9 g/dL (ref 3.5–5.0)
Alkaline Phosphatase: 73 U/L (ref 38–126)
Anion gap: 12 (ref 5–15)
BUN: 10 mg/dL (ref 6–20)
CO2: 22 mmol/L (ref 22–32)
Calcium: 9.2 mg/dL (ref 8.9–10.3)
Chloride: 102 mmol/L (ref 98–111)
Creatinine, Ser: 0.78 mg/dL (ref 0.61–1.24)
GFR, Estimated: >60 mL/min (ref >60)
Glucose, Bld: 100 mg/dL — ABNORMAL HIGH (ref 70–99)
Potassium: 3.1 mmol/L — ABNORMAL LOW (ref 3.5–5.1)
Sodium: 136 mmol/L (ref 135–145)
Total Bilirubin: 0.6 mg/dL (ref 0.3–1.2)
Total Protein: 7.7 g/dL (ref 6.5–8.1)

## 2021-10-11 LAB — RESP PANEL BY RT-PCR (FLU A&B, COVID) ARPGX2
Influenza A by PCR: NEGATIVE
Influenza B by PCR: NEGATIVE
SARS Coronavirus 2 by RT PCR: NEGATIVE

## 2021-10-11 MED ORDER — BENZONATATE 100 MG PO CAPS: 100.0000 mg | ORAL_CAPSULE | Freq: Three times a day (TID) | ORAL | 0 refills | Status: AC

## 2021-10-11 MED ORDER — ONDANSETRON 4 MG PO TBDP: 4.0000 mg | ORAL_TABLET | Freq: Three times a day (TID) | ORAL | 0 refills | Status: AC | PRN

## 2021-10-11 MED ORDER — SODIUM CHLORIDE 0.9 % IV BOLUS
1000.0000 mL | Freq: Once | INTRAVENOUS | Status: AC
  Administered 2021-10-11: 1000 mL via INTRAVENOUS

## 2021-10-11 MED ORDER — FAMOTIDINE 20 MG PO TABS: 20.0000 mg | ORAL_TABLET | Freq: Every day | ORAL | 0 refills | Status: AC

## 2021-10-11 MED ORDER — ONDANSETRON HCL 4 MG/2ML IJ SOLN
4.0000 mg | Freq: Once | INTRAMUSCULAR | Status: AC
  Administered 2021-10-11: 4 mg via INTRAVENOUS
  Filled 2021-10-11: qty 2

## 2021-10-11 MED ORDER — POTASSIUM CHLORIDE CRYS ER 20 MEQ PO TBCR
40.0000 meq | EXTENDED_RELEASE_TABLET | Freq: Once | ORAL | Status: AC
  Administered 2021-10-11: 40 meq via ORAL
  Filled 2021-10-11: qty 2

## 2021-10-11 NOTE — Discharge Instructions (Signed)
Your COVID and flu test came back negative.  However, I feel this is still a viral illness, and should be treated symptomatically. Your chest x-ray was clear, showing no signs of pneumonia. You Zofran as needed for nausea or vomiting. Take Pepcid daily to decrease morning nausea and vomiting. Use Imodium as needed for diarrhea. Use Tessalon as needed for cough. Nutrition well-hydrated water. Return to the emergency room if you develop persistent chest pain, increased difficulty breathing, or any new, worsening, or concerning symptoms

## 2021-10-11 NOTE — ED Provider Notes (Signed)
[facility] EMERGENCY DEPARTMENT Provider Note   CSN: [id number] Arrival date & time: 10/11/21  1323     History Chief Complaint  Patient presents with   URI    Authur Swaziland is a 60 y.o. male presenting for evaluation of cough, congestion, body aches, diarrhea.  Patient states he has not felt well for a month, since getting the flu vaccine.  He reports fatigue.  He has had intermittent early morning vomiting since then.  Over the past week, he has had increase in his symptoms.  Reports hot and cold sweats, body aches, nasal congestion, nonproductive cough.  For the past 2 days he has had frequent diarrhea.  Blood on the toilet paper when he wipes, but no blood in the stool.  He does have a history of hemorrhoids.  He was around his granddaughter last week who was flu positive.  Reports occasional shortness of breath, none currently.  No chest pain.  He denies documented fevers.  No sore throat.  Intermittent nausea, still having morning vomiting.  He smokes a half a pack of cigarettes a day.  No history of lung problems.  He has not taken anything for his symptoms.  He reports feeling dizzy and lightheaded when he stands up, no dizziness at rest. Decreased appetite this week.    HPI     Past Medical History:  Diagnosis Date   Arthritis    Environmental allergies    Gout    High cholesterol    Hypertension     There are no problems to display for this patient.   Past Surgical History:  Procedure Laterality Date   NECK SURGERY     ? gland removed from right side of neck at age 40        No family history on file.  Social History   Tobacco Use   Smoking status: Every Day    Packs/day: 1.00    Types: Cigarettes   Smokeless tobacco: Never  Vaping Use   Vaping Use: Never used  Substance Use Topics   Alcohol use: Yes    Comment: occasionally   Drug use: Not Currently    Types: Marijuana    Home Medications Prior to Admission medications   Medication  Sig Start Date End Date Taking? Authorizing Provider  amLODipine (NORVASC) 10 MG tablet TAKE 1 Tablet BY MOUTH ONCE EVERY DAY 10/13/20  Yes [provider]  benzonatate (TESSALON) 100 MG capsule Take 1 capsule (100 mg total) by mouth every 8 (eight) hours. 10/11/21  Yes Kattaleya Alia, PA-C  famotidine (PEPCID) 20 MG tablet Take 1 tablet (20 mg total) by mouth daily for 7 days. 10/11/21 10/18/21 Yes Torrance Stockley, PA-C  hydrochlorothiazide (HYDRODIURIL) 25 MG tablet Take 25 mg by mouth daily.   Yes [provider]  losartan (COZAAR) 100 MG tablet Take 100 mg by mouth daily. 10/13/20  Yes [provider]  ondansetron (ZOFRAN ODT) 4 MG disintegrating tablet Take 1 tablet (4 mg total) by mouth every 8 (eight) hours as needed for nausea or vomiting. 10/11/21  Yes Bethzaida Boord, PA-C  PROZAC 10 MG capsule Take 10 mg by mouth 3 (three) times daily. 10/13/20  Yes [provider]  allopurinol (ZYLOPRIM) 100 MG tablet Take 100 mg by mouth daily.    [provider]  meloxicam (MOBIC) 15 MG tablet Take 15 mg by mouth daily as needed. 10/13/20   [provider]  potassium chloride (KLOR-CON) 10 MEQ tablet Take by mouth. 10/28/20  11/04/20  [provider]  traZODone (DESYREL) 100 MG tablet Take 100 mg by mouth daily. 10/13/20   [provider]    Allergies    Patient has no known allergies.  Review of Systems   Review of Systems  Constitutional:  Positive for chills.  HENT:  Positive for congestion.   Respiratory:  Positive for cough.   Gastrointestinal:  Positive for diarrhea, nausea and vomiting.  All other systems reviewed and are negative.  Physical Exam Updated Vital Signs BP (!) 168/101   Pulse 80   Temp 98.4 F (36.9 C) (Oral)   Resp 18   Ht 6\' 1"  (1.854 m)   Wt 79.7 kg   SpO2 97%   BMI 23.17 kg/m   Physical Exam Vitals and nursing note reviewed.  Constitutional:      General: He is not in acute distress.     Appearance: Normal appearance.     Comments: nontoxic  HENT:     Head: Normocephalic and atraumatic.  Eyes:     Conjunctiva/sclera: Conjunctivae normal.     Pupils: Pupils are equal, round, and reactive to light.  Cardiovascular:     Rate and Rhythm: Normal rate and regular rhythm.     Pulses: Normal pulses.     Comments: No tachycardia at rest, heart rate increases over 100 when he sits up in bed. Pulmonary:     Effort: Pulmonary effort is normal. No respiratory distress.     Breath sounds: Normal breath sounds. No wheezing.     Comments: Speaking in full sentences.  Clear lung sounds in all fields. Abdominal:     General: There is no distension.     Palpations: Abdomen is soft. There is no mass.     Tenderness: There is no abdominal tenderness. There is no guarding or rebound.     Hernia: A hernia is present.     Comments: Umbilical hernia which is soft and reducible.   Musculoskeletal:        General: Normal range of motion.     Cervical back: Normal range of motion and neck supple.  Skin:    General: Skin is warm and dry.     Capillary Refill: Capillary refill takes less than 2 seconds.  Neurological:     Mental Status: He is alert and oriented to person, place, and time.  Psychiatric:        Mood and Affect: Mood and affect normal.        Speech: Speech normal.        Behavior: Behavior normal.    ED Results / Procedures / Treatments   Labs (all labs ordered are listed, but only abnormal results are displayed) Labs Reviewed  CBC WITH DIFFERENTIAL/PLATELET - Abnormal; Notable for the following components:      Result Value   Abs Immature Granulocytes 0.10 (*)    All other components within normal limits  COMPREHENSIVE METABOLIC PANEL - Abnormal; Notable for the following components:   Potassium 3.1 (*)    Glucose, Bld 100 (*)    All other components within normal limits  RESP PANEL BY RT-PCR (FLU A&B, COVID) ARPGX2    EKG EKG  Interpretation  Date/Time:  Tuesday October 11 2021 13:42:43 EDT Ventricular Rate:  124 PR Interval:  134 QRS Duration: 92 QT Interval:  342 QTC Calculation: 491 R Axis:   59 Text Interpretation: Sinus tachycardia Confirmed by [id number] ([id number]) on 10/11/2021 1:57:29 PM  Radiology DG Chest 2 View  Result Date: 10/11/2021 CLINICAL DATA:  Cough EXAM: CHEST - 2 VIEW COMPARISON:  Chest x-ray 10/28/2020 FINDINGS: Heart size and mediastinal contours are within normal limits. No suspicious pulmonary opacities identified. No pleural effusion or pneumothorax visualized. No acute osseous abnormality appreciated. IMPRESSION: No acute intrathoracic process identified. Electronically Signed   By: Jannifer Hick M.D.   On: 10/11/2021 15:08    Procedures Procedures   Medications Ordered in ED Medications  sodium chloride 0.9 % bolus 1,000 mL ( Intravenous Stopped 10/11/21 1620)  ondansetron (ZOFRAN) injection 4 mg (4 mg Intravenous Given 10/11/21 1516)  potassium chloride SA (KLOR-CON) CR tablet 40 mEq (40 mEq Oral Given 10/11/21 1602)    ED Course  I have reviewed the triage vital signs and the nursing notes.  Pertinent labs & imaging results that were available during my care of the patient were reviewed by me and considered in my medical decision making (see chart for details).    MDM Rules/Calculators/A&P                           Patient presenting for evaluation of 1 week history of viral symptoms.  On exam, patient appears nontoxic.  He does appear slightly dehydrated, and that his heart rate goes up when he sits up in bed, and he is reporting orthostatic symptoms.  Considering a week of symptoms, obtain x-ray to ensure no superimposed bacterial infection.  Obtain labs to ensure no significant dehydration causing an AKI or electrolyte abnormality.  Will obtain respiratory panel.  Will treat symptomatically.  Respiratory panel negative.  Labs interpreted by me, overall reassuring.   Mild hypokalemia, however similar to previous.  Will replenish orally.  Chest x-ray viewed and independently interpreted by me, no pneumonia pneumothorax or effusion.  On reevaluation, patient reports improvement of symptoms after fluids.  Orthostatics are negative.  Discussed findings with patient.  Discussed continued symptomatic management.  At this time, patient appears safe for discharge.  Return precautions given.  Patient states he understands and agrees to plan  Final Clinical Impression(s) / ED Diagnoses Final diagnoses:  Viral illness    Rx / DC Orders ED Discharge Orders          Ordered    ondansetron (ZOFRAN ODT) 4 MG disintegrating tablet  Every 8 hours PRN        10/11/21 1631    benzonatate (TESSALON) 100 MG capsule  Every 8 hours        10/11/21 1631    famotidine (PEPCID) 20 MG tablet  Daily        10/11/21 1631             Jalayia Bagheri, PA-C 10/11/21 1731    Virgina Norfolk, DO 10/14/21 0710

## 2021-10-11 NOTE — ED Notes (Signed)
ED Provider at bedside. 

## 2021-10-11 NOTE — ED Triage Notes (Signed)
Diarrhea, cough, sore throat, runny nose, chills, headache and fatigue since getting his flu shot October 4th.

## 2022-04-03 DIAGNOSIS — F1420 Cocaine dependence, uncomplicated: Secondary | ICD-10-CM | POA: Insufficient documentation

## 2022-04-03 DIAGNOSIS — N3000 Acute cystitis without hematuria: Secondary | ICD-10-CM | POA: Insufficient documentation

## 2022-04-03 DIAGNOSIS — K219 Gastro-esophageal reflux disease without esophagitis: Secondary | ICD-10-CM | POA: Insufficient documentation

## 2022-06-23 DIAGNOSIS — F1020 Alcohol dependence, uncomplicated: Secondary | ICD-10-CM | POA: Insufficient documentation

## 2022-06-30 IMAGING — DX DG CHEST 2V
2 series · 2 of 2 positions shown · non-contrast
Comparison: Chest x-ray 10/28/2020

CLINICAL DATA: Cough

EXAM:
CHEST - 2 VIEW

[chest pa]
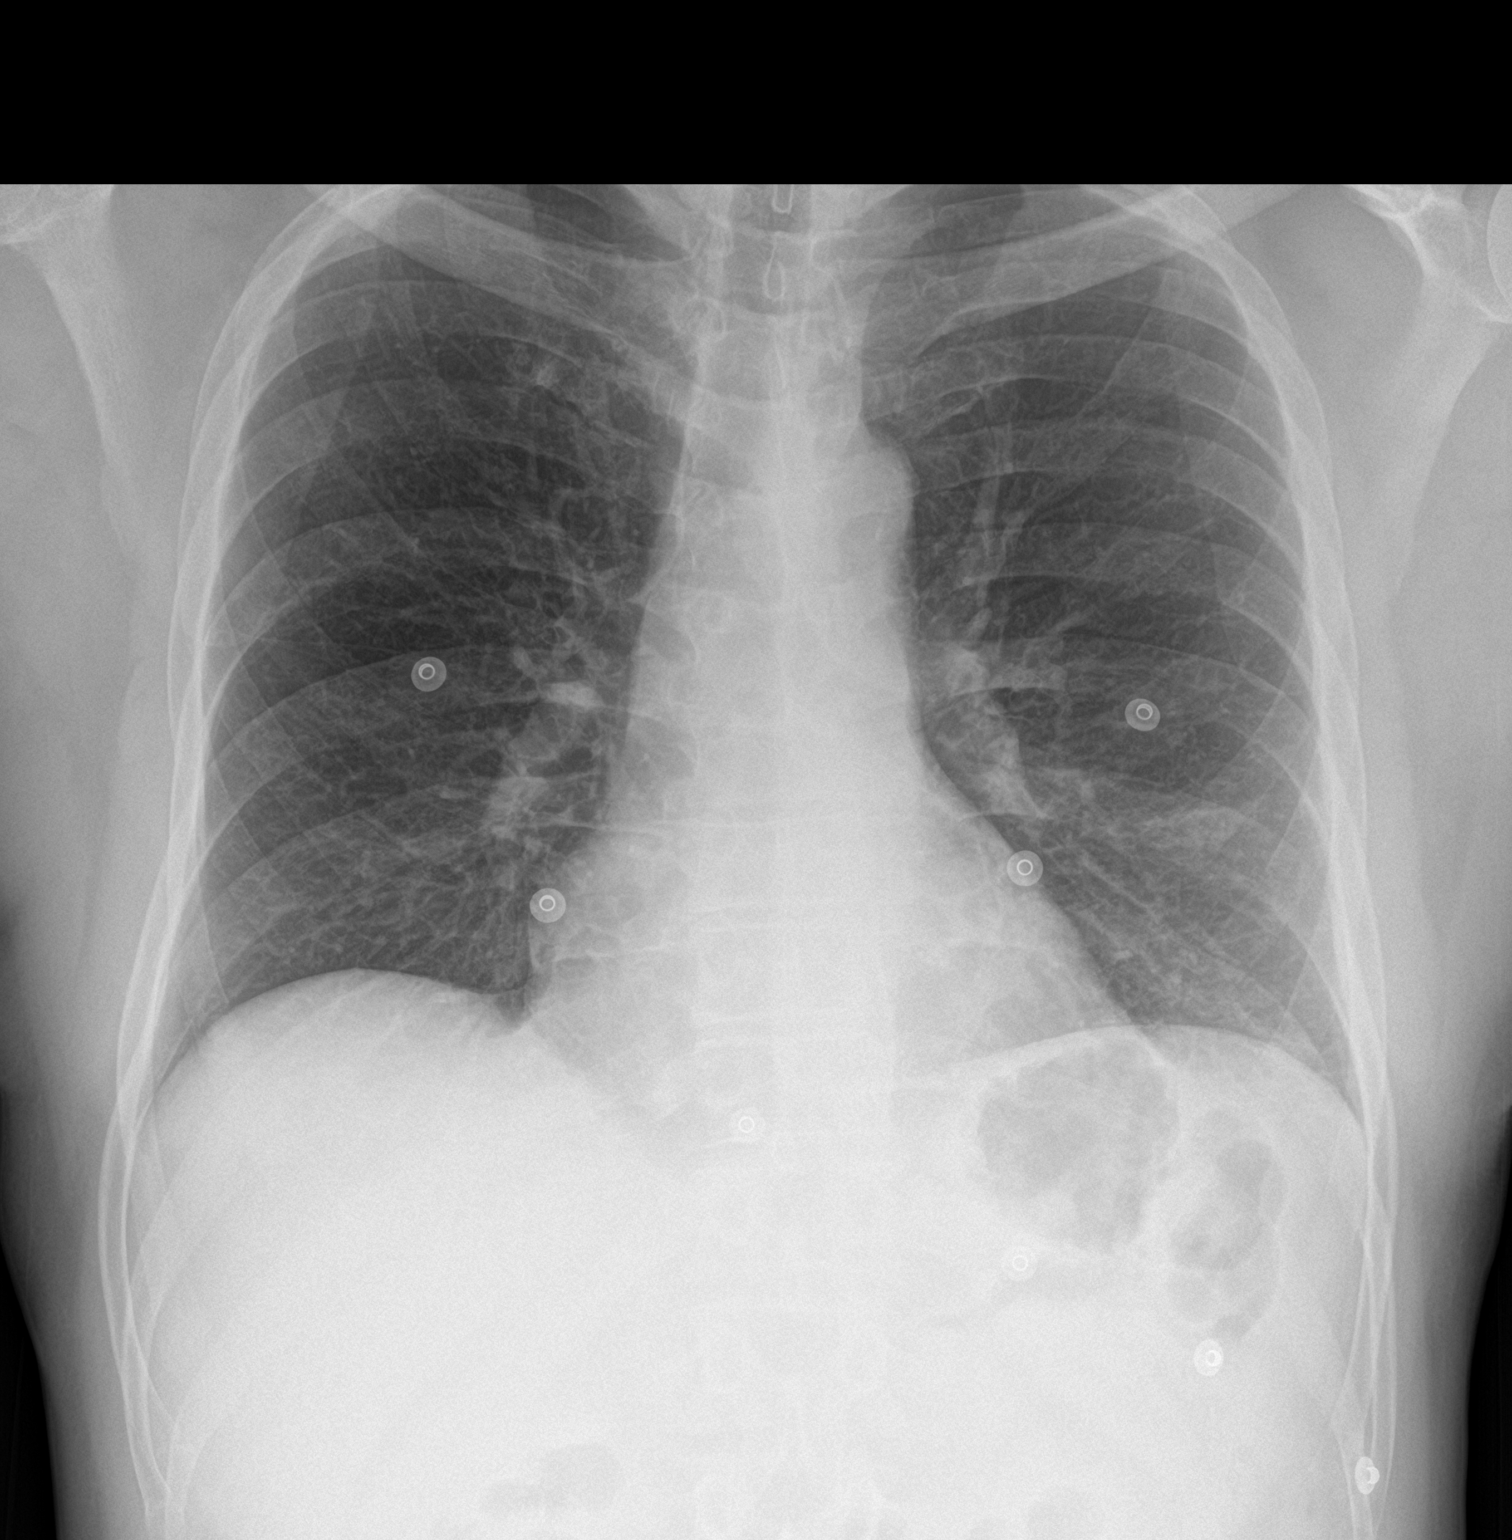

[chest lat]
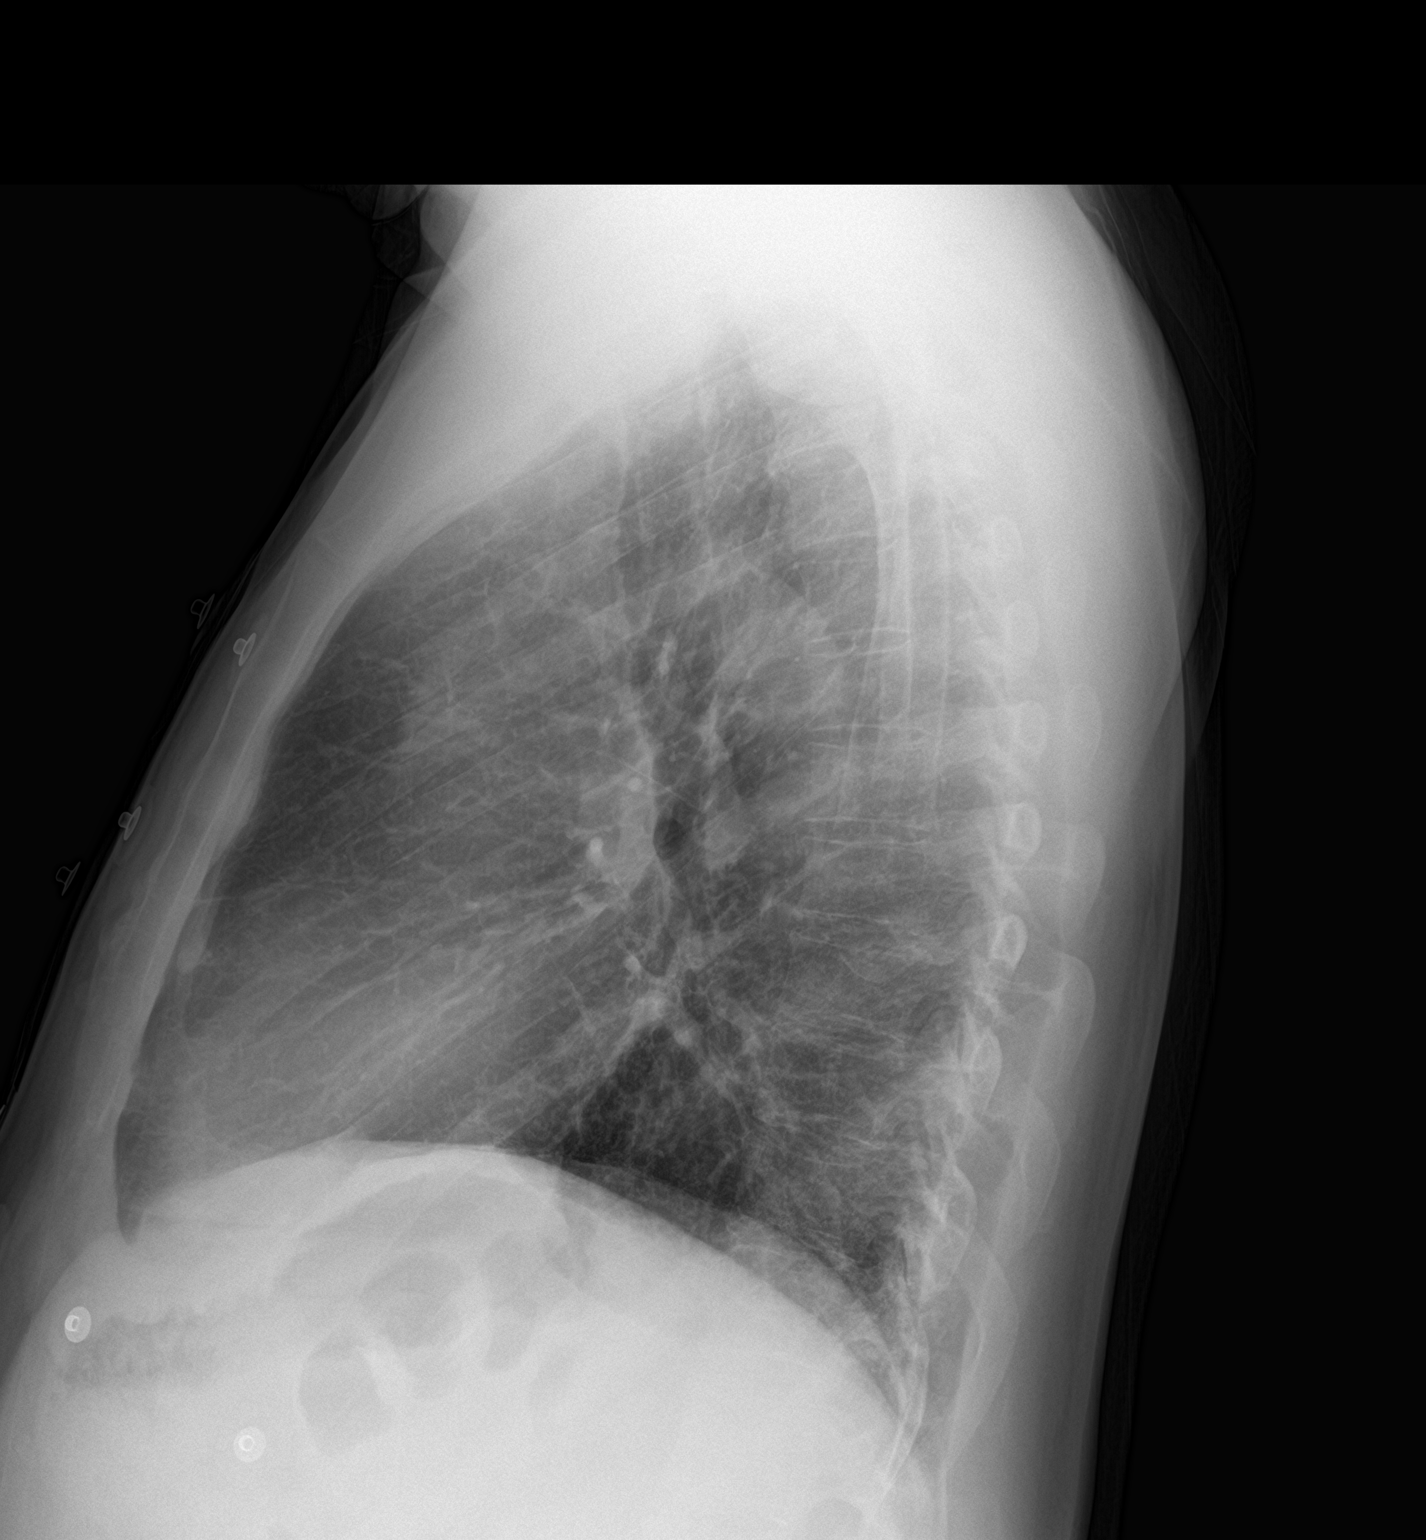

[2 of 2 positions shown; findings below may reference images not displayed]

FINDINGS: Heart size and mediastinal contours are within normal limits. No
suspicious pulmonary opacities identified.

No pleural effusion or pneumothorax visualized.

No acute osseous abnormality appreciated.
IMPRESSION: No acute intrathoracic process identified.

## 2022-11-14 ENCOUNTER — Emergency Department (HOSPITAL_BASED_OUTPATIENT_CLINIC_OR_DEPARTMENT_OTHER): Payer: Self-pay

## 2022-11-14 ENCOUNTER — Emergency Department (HOSPITAL_BASED_OUTPATIENT_CLINIC_OR_DEPARTMENT_OTHER)
Admission: EM | Admit: 2022-11-14 | Discharge: 2022-11-14 | Disposition: A | Discharge status: Home / Self Care | Payer: Self-pay | Attending: Emergency Medicine | Admitting: Emergency Medicine

## 2022-11-14 ENCOUNTER — Other Ambulatory Visit: Payer: Self-pay

## 2022-11-14 DIAGNOSIS — R739 Hyperglycemia, unspecified: Secondary | ICD-10-CM | POA: Insufficient documentation

## 2022-11-14 DIAGNOSIS — I10 Essential (primary) hypertension: Secondary | ICD-10-CM | POA: Insufficient documentation

## 2022-11-14 DIAGNOSIS — Z79899 Other long term (current) drug therapy: Secondary | ICD-10-CM | POA: Insufficient documentation

## 2022-11-14 DIAGNOSIS — R519 Headache, unspecified: Secondary | ICD-10-CM | POA: Insufficient documentation

## 2022-11-14 DIAGNOSIS — Z1152 Encounter for screening for COVID-19: Secondary | ICD-10-CM | POA: Insufficient documentation

## 2022-11-14 LAB — CBC WITH DIFFERENTIAL/PLATELET
Abs Immature Granulocytes: 0.06 10*3/uL (ref 0.00–0.07)
Basophils Absolute: 0 10*3/uL (ref 0.0–0.1)
Basophils Relative: 1 %
Eosinophils Absolute: 0.1 10*3/uL (ref 0.0–0.5)
Eosinophils Relative: 1 %
HCT: 41.4 % (ref 39.0–52.0)
Hemoglobin: 14.6 g/dL (ref 13.0–17.0)
Immature Granulocytes: 1 %
Lymphocytes Relative: 24 %
Lymphs Abs: 1.6 10*3/uL (ref 0.7–4.0)
MCH: 29.9 pg (ref 26.0–34.0)
MCHC: 35.3 g/dL (ref 30.0–36.0)
MCV: 84.8 fL (ref 80.0–100.0)
Monocytes Absolute: 0.7 10*3/uL (ref 0.1–1.0)
Monocytes Relative: 10 %
Neutro Abs: 4.2 10*3/uL (ref 1.7–7.7)
Neutrophils Relative %: 63 %
Platelets: 224 10*3/uL (ref 150–400)
RBC: 4.88 MIL/uL (ref 4.22–5.81)
RDW: 13.7 % (ref 11.5–15.5)
WBC: 6.7 10*3/uL (ref 4.0–10.5)
nRBC: 0 % (ref 0.0–0.2)

## 2022-11-14 LAB — BASIC METABOLIC PANEL
Anion gap: 9 (ref 5–15)
BUN: 13 mg/dL (ref 8–23)
CO2: 21 mmol/L — ABNORMAL LOW (ref 22–32)
Calcium: 8.8 mg/dL — ABNORMAL LOW (ref 8.9–10.3)
Chloride: 108 mmol/L (ref 98–111)
Creatinine, Ser: 1.02 mg/dL (ref 0.61–1.24)
GFR, Estimated: >60 mL/min (ref >60)
Glucose, Bld: 130 mg/dL — ABNORMAL HIGH (ref 70–99)
Potassium: 3.4 mmol/L — ABNORMAL LOW (ref 3.5–5.1)
Sodium: 138 mmol/L (ref 135–145)

## 2022-11-14 LAB — RESP PANEL BY RT-PCR (FLU A&B, COVID) ARPGX2
Influenza A by PCR: NEGATIVE
Influenza B by PCR: NEGATIVE
SARS Coronavirus 2 by RT PCR: NEGATIVE

## 2022-11-14 MED ORDER — ACETAMINOPHEN 500 MG PO TABS
1000.0000 mg | ORAL_TABLET | Freq: Once | ORAL | Status: AC
  Administered 2022-11-14: 1000 mg via ORAL
  Filled 2022-11-14: qty 2

## 2022-11-14 MED ORDER — AMLODIPINE BESYLATE 10 MG PO TABS: 10.0000 mg | ORAL_TABLET | Freq: Every day | ORAL | 0 refills | Status: AC

## 2022-11-14 NOTE — ED Notes (Signed)
ED Provider at bedside. 

## 2022-11-14 NOTE — Discharge Instructions (Signed)
Return to the ED with any new or worsening signs or symptoms Please begin taking blood pressure medication I have refilled this for you Please continue with follow-up in 2 days as discussed Please continue pushing fluids to include Body Armor, Pedialyte.  Please take Tylenol for any future headaches.

## 2022-11-14 NOTE — ED Triage Notes (Signed)
Pt complains of high blood pressure, headache, SOB, dizziness x 1 week. Pt has been out of BP medication for over 1 week and can't see doctor for 2 more days

## 2022-11-14 NOTE — ED Provider Notes (Signed)
[facility] EMERGENCY DEPARTMENT Provider Note   CSN: [id number] Arrival date & time: 11/14/22  1238     History  Chief Complaint  Patient presents with   Headache   Shortness of Breath    Edwin Odonnell is a 61 y.o. male with medical history of arthritis, gout, high cholesterol, hypertension.  The patient presents to the ED for evaluation of generalized body aches and chills, fever, sore throat, headache, shortness of breath and dizziness for 1 week.  The patient states that he is currently unemployed, denies any known sick contacts.  Patient denies being vaccinated against influenza, COVID.  Patient denies any nausea, vomiting, diarrhea.   Headache Associated symptoms: dizziness, fever and sore throat   Associated symptoms: no diarrhea, no nausea and no vomiting   Shortness of Breath Associated symptoms: fever, headaches and sore throat   Associated symptoms: no vomiting        Home Medications Prior to Admission medications   Medication Sig Start Date End Date Taking? Authorizing Provider  amLODipine (NORVASC) 10 MG tablet Take 1 tablet (10 mg total) by mouth daily. 11/14/22  Yes Al Decant, PA-C  allopurinol (ZYLOPRIM) 100 MG tablet Take 100 mg by mouth daily.    [provider]  benzonatate (TESSALON) 100 MG capsule Take 1 capsule (100 mg total) by mouth every 8 (eight) hours. 10/11/21   Caccavale, Sophia, PA-C  famotidine (PEPCID) 20 MG tablet Take 1 tablet (20 mg total) by mouth daily for 7 days. 10/11/21 10/18/21  Caccavale, Sophia, PA-C  hydrochlorothiazide (HYDRODIURIL) 25 MG tablet Take 25 mg by mouth daily.    [provider]  losartan (COZAAR) 100 MG tablet Take 100 mg by mouth daily. 10/13/20   [provider]  meloxicam (MOBIC) 15 MG tablet Take 15 mg by mouth daily as needed. 10/13/20   [provider]  ondansetron (ZOFRAN ODT) 4 MG disintegrating tablet Take 1 tablet (4 mg total) by mouth every 8 (eight) hours  as needed for nausea or vomiting. 10/11/21   Caccavale, Sophia, PA-C  potassium chloride (KLOR-CON) 10 MEQ tablet Take by mouth. 10/28/20 11/04/20  [provider]  PROZAC 10 MG capsule Take 10 mg by mouth 3 (three) times daily. 10/13/20   [provider]  traZODone (DESYREL) 100 MG tablet Take 100 mg by mouth daily. 10/13/20   [provider]      Allergies    Patient has no known allergies.    Review of Systems   Review of Systems  Constitutional:  Positive for chills and fever.  HENT:  Positive for sore throat.   Respiratory:  Positive for shortness of breath.   Gastrointestinal:  Negative for diarrhea, nausea and vomiting.  Neurological:  Positive for dizziness and headaches.  All other systems reviewed and are negative.   Physical Exam Updated Vital Signs BP 116/79   Pulse 97   Temp 98.2 F (36.8 C)   Resp (!) 21   Ht 6' (1.829 m)   Wt 95.3 kg   SpO2 99%   BMI 28.48 kg/m  Physical Exam Vitals and nursing note reviewed.  Constitutional:      General: He is not in acute distress.    Appearance: He is well-developed.  HENT:     Head: Normocephalic and atraumatic.     Nose: Nose normal.     Mouth/Throat:     Mouth: Mucous membranes are moist.     Pharynx: Oropharynx is clear.  Eyes:  Extraocular Movements: Extraocular movements intact.     Conjunctiva/sclera: Conjunctivae normal.     Pupils: Pupils are equal, round, and reactive to light.  Cardiovascular:     Rate and Rhythm: Normal rate and regular rhythm.     Heart sounds: No murmur heard. Pulmonary:     Effort: Pulmonary effort is normal. No respiratory distress.     Breath sounds: Normal breath sounds. No wheezing.  Abdominal:     General: Abdomen is flat. Bowel sounds are normal.     Palpations: Abdomen is soft.     Tenderness: There is no abdominal tenderness.  Musculoskeletal:        General: No swelling.     Cervical back: Neck supple.  Skin:    General: Skin is warm and  dry.     Capillary Refill: Capillary refill takes less than 2 seconds.  Neurological:     General: No focal deficit present.     Mental Status: He is alert and oriented to person, place, and time.     GCS: GCS eye subscore is 4. GCS verbal subscore is 5. GCS motor subscore is 6.     Cranial Nerves: Cranial nerves 2-12 are intact. No cranial nerve deficit.     Sensory: Sensation is intact. No sensory deficit.     Motor: Motor function is intact. No weakness.     Coordination: Coordination is intact. Heel to [facility] Test normal.  Psychiatric:        Mood and Affect: Mood normal.     ED Results / Procedures / Treatments   Labs (all labs ordered are listed, but only abnormal results are displayed) Labs Reviewed  BASIC METABOLIC PANEL - Abnormal; Notable for the following components:      Result Value   Potassium 3.4 (*)    CO2 21 (*)    Glucose, Bld 130 (*)    Calcium 8.8 (*)    All other components within normal limits  RESP PANEL BY RT-PCR (FLU A&B, COVID) ARPGX2  CBC WITH DIFFERENTIAL/PLATELET    EKG EKG Interpretation  Date/Time:  Tuesday November 14 2022 12:58:51 EST Ventricular Rate:  100 PR Interval:  148 QRS Duration: 94 QT Interval:  362 QTC Calculation: 466 R Axis:   46 Text Interpretation: Normal sinus rhythm Confirmed by Virgina Norfolk ([id number]) on 11/14/2022 1:04:04 PM  Radiology DG Chest 1 View  Result Date: 11/14/2022 CLINICAL DATA:  Shortness of breath with hypertension, headache, and dizziness 1 week. EXAM: CHEST  1 VIEW COMPARISON:  03/31/2022 FINDINGS: Lungs are adequately inflated without airspace consolidation or effusion. Cardiomediastinal silhouette, bones and soft tissues are normal. IMPRESSION: No active disease. Electronically Signed   By: Elberta Fortis M.D.   On: 11/14/2022 14:53    Procedures Procedures   Medications Ordered in ED Medications  acetaminophen (TYLENOL) tablet 1,000 mg (1,000 mg Oral Given 11/14/22 1559)    ED Course/ Medical  Decision Making/ A&P                           Medical Decision Making Amount and/or Complexity of Data Reviewed Labs: ordered. Radiology: ordered.  Risk OTC drugs.   61 year old male presents to the ED for evaluation.  Please see HPI for further details.  On my examination the patient is afebrile, nontachycardic.  The patient lung sounds are clear bilaterally, he is not hypoxic.  The patient abdomen is soft and compressible throughout.  Patient neurological examination shows no focal  neurodeficits.    Patient will be assessed utilizing following lab studies and imaging studies to include CBC, BMP, respiratory panel, chest x-ray, EKG.  Patient initially provided 1 g Tylenol for headache relief.  Patient CBC shows no leukocytosis or anemia.  The patient BMP shows slightly decreased potassium of 3.4, elevated glucose to 130 however no anion gap.  Patient respiratory panel negative for all.  Patient chest x-ray shows no consolidations, effusions.  The patient EKG is nonischemic.  After Tylenol was given, patient reports that headache feels better.  Patient also requesting blood pressure medication refill.  At this time, most often cause of patient symptoms due to viral syndrome.  The patient will be advised to treat symptoms at home conservatively.  Patient advised to continue pushing fluids.  Patient states he has follow-up appointment with his doctor in 2 days.  I will refill the patient's blood pressure medication here.  Patient encouraged to continue with follow-up.  Return precautions provided and patient voiced understanding.  Patient had all his questions answered his satisfaction.  The patient is stable for discharge.  Final Clinical Impression(s) / ED Diagnoses Final diagnoses:  Acute nonintractable headache, unspecified headache type    Rx / DC Orders ED Discharge Orders          Ordered    amLODipine (NORVASC) 10 MG tablet  Daily        11/14/22 1717               Al Decant, PA-C 11/14/22 1717    Pricilla Loveless, MD 11/18/22 1021

## 2023-09-02 ENCOUNTER — Other Ambulatory Visit: Payer: Self-pay

## 2023-09-02 ENCOUNTER — Encounter (HOSPITAL_BASED_OUTPATIENT_CLINIC_OR_DEPARTMENT_OTHER): Payer: Self-pay

## 2023-09-02 ENCOUNTER — Emergency Department (HOSPITAL_BASED_OUTPATIENT_CLINIC_OR_DEPARTMENT_OTHER): Payer: MEDICAID

## 2023-09-02 ENCOUNTER — Emergency Department (HOSPITAL_BASED_OUTPATIENT_CLINIC_OR_DEPARTMENT_OTHER)
Admission: EM | Admit: 2023-09-02 | Discharge: 2023-09-02 | Disposition: A | Discharge status: Home / Self Care | Payer: MEDICAID | Attending: Emergency Medicine | Admitting: Emergency Medicine

## 2023-09-02 DIAGNOSIS — R509 Fever, unspecified: Secondary | ICD-10-CM | POA: Diagnosis present

## 2023-09-02 DIAGNOSIS — R55 Syncope and collapse: Secondary | ICD-10-CM | POA: Insufficient documentation

## 2023-09-02 DIAGNOSIS — R42 Dizziness and giddiness: Secondary | ICD-10-CM | POA: Insufficient documentation

## 2023-09-02 DIAGNOSIS — N390 Urinary tract infection, site not specified: Secondary | ICD-10-CM | POA: Diagnosis not present

## 2023-09-02 DIAGNOSIS — Z20822 Contact with and (suspected) exposure to covid-19: Secondary | ICD-10-CM | POA: Insufficient documentation

## 2023-09-02 DIAGNOSIS — R079 Chest pain, unspecified: Secondary | ICD-10-CM | POA: Insufficient documentation

## 2023-09-02 DIAGNOSIS — M791 Myalgia, unspecified site: Secondary | ICD-10-CM | POA: Insufficient documentation

## 2023-09-02 LAB — BASIC METABOLIC PANEL
Anion gap: 14 (ref 5–15)
BUN: 18 mg/dL (ref 8–23)
CO2: 20 mmol/L — ABNORMAL LOW (ref 22–32)
Calcium: 9 mg/dL (ref 8.9–10.3)
Chloride: 103 mmol/L (ref 98–111)
Creatinine, Ser: 1.01 mg/dL (ref 0.61–1.24)
GFR, Estimated: >60 mL/min (ref >60)
Glucose, Bld: 116 mg/dL — ABNORMAL HIGH (ref 70–99)
Potassium: 3.9 mmol/L (ref 3.5–5.1)
Sodium: 137 mmol/L (ref 135–145)

## 2023-09-02 LAB — URINALYSIS, ROUTINE W REFLEX MICROSCOPIC
Bilirubin Urine: NEGATIVE
Glucose, UA: NEGATIVE mg/dL
Ketones, ur: NEGATIVE mg/dL
Nitrite: POSITIVE — AB
Protein, ur: 30 mg/dL — AB
Specific Gravity, Urine: 1.02 (ref 1.005–1.030)
pH: 5 (ref 5.0–8.0)

## 2023-09-02 LAB — PROTIME-INR
INR: 0.9 (ref 0.8–1.2)
Prothrombin Time: 12.4 seconds (ref 11.4–15.2)

## 2023-09-02 LAB — URINALYSIS, MICROSCOPIC (REFLEX)

## 2023-09-02 LAB — CBC
HCT: 43.7 % (ref 39.0–52.0)
Hemoglobin: 15.4 g/dL (ref 13.0–17.0)
MCH: 30 pg (ref 26.0–34.0)
MCHC: 35.2 g/dL (ref 30.0–36.0)
MCV: 85 fL (ref 80.0–100.0)
Platelets: 296 10*3/uL (ref 150–400)
RBC: 5.14 MIL/uL (ref 4.22–5.81)
RDW: 13.1 % (ref 11.5–15.5)
WBC: 16.4 10*3/uL — ABNORMAL HIGH (ref 4.0–10.5)
nRBC: 0 % (ref 0.0–0.2)

## 2023-09-02 LAB — LACTIC ACID, PLASMA: Lactic Acid, Venous: 1.9 mmol/L (ref 0.5–1.9)

## 2023-09-02 LAB — TROPONIN I (HIGH SENSITIVITY)
Troponin I (High Sensitivity): 4 ng/L (ref <18)
Troponin I (High Sensitivity): 5 ng/L (ref <18)

## 2023-09-02 LAB — SARS CORONAVIRUS 2 BY RT PCR: SARS Coronavirus 2 by RT PCR: NEGATIVE

## 2023-09-02 MED ORDER — IOHEXOL 350 MG/ML SOLN
100.0000 mL | Freq: Once | INTRAVENOUS | Status: AC | PRN
  Administered 2023-09-02: 100 mL via INTRAVENOUS

## 2023-09-02 MED ORDER — LACTATED RINGERS IV BOLUS
2000.0000 mL | Freq: Once | INTRAVENOUS | Status: AC
  Administered 2023-09-02: 2000 mL via INTRAVENOUS

## 2023-09-02 MED ORDER — CEFDINIR 300 MG PO CAPS: 300.0000 mg | ORAL_CAPSULE | Freq: Two times a day (BID) | ORAL | 0 refills | Status: AC

## 2023-09-02 MED ORDER — ACETAMINOPHEN 500 MG PO TABS
1000.0000 mg | ORAL_TABLET | Freq: Once | ORAL | Status: AC
  Administered 2023-09-02: 1000 mg via ORAL
  Filled 2023-09-02: qty 2

## 2023-09-02 MED ORDER — SODIUM CHLORIDE 0.9 % IV SOLN
2.0000 g | Freq: Once | INTRAVENOUS | Status: AC
  Administered 2023-09-02: 2 g via INTRAVENOUS
  Filled 2023-09-02: qty 20

## 2023-09-02 NOTE — ED Triage Notes (Signed)
The patient is having shortness of breath, cough,  body aches, fever the last three days. Today he started having chest pain and then had a syncopal episode at [employer]. He was sitting in a chair so he did not fall.

## 2023-09-02 NOTE — ED Provider Notes (Signed)
[facility] Provider Note   CSN: [id number] Arrival date & time: 09/02/23  1751     History Chief Complaint  Patient presents with   Loss of Consciousness   Generalized Body Aches   Fever   Chest Pain    HPI Edwin Odonnell is a 62 y.o. male presenting for multiple complaints.  Primarily he is endorsing fever and dysuria as well as urinary frequency. He states he has had malaise and fatigue throughout the past week with subjective fevers and chills at home. History of similar secondary to urinary tract infections initially thought to be urinary retention. He has been having frequent urination and feels able to void okay today. Otherwise ambulatory tolerating p.o. intake prior to this event.  States the main reason he came in tonight was he felt the symptoms get acutely worsened with chest pain and lightheadedness and he syncopized at [employer].   Patient's recorded medical, surgical, social, medication list and allergies were reviewed in the Snapshot window as part of the initial history.   Review of Systems   Review of Systems  Constitutional:  Positive for fever. Negative for chills.  HENT:  Negative for ear pain and sore throat.   Eyes:  Negative for pain and visual disturbance.  Respiratory:  Positive for cough. Negative for shortness of breath.   Cardiovascular:  Positive for chest pain. Negative for palpitations.  Gastrointestinal:  Negative for abdominal pain and vomiting.  Genitourinary:  Negative for dysuria and hematuria.  Musculoskeletal:  Negative for arthralgias and back pain.  Skin:  Negative for color change and rash.  Neurological:  Positive for syncope. Negative for seizures.  All other systems reviewed and are negative.   Physical Exam Updated Vital Signs BP (!) 142/78   Pulse 87   Temp 99 F (37.2 C) (Temporal)   Resp 16   Ht 6\' 1"  (1.854 m)   Wt 94.3 kg   SpO2 93%   BMI 27.44 kg/m  Physical  Exam Vitals and nursing note reviewed.  Constitutional:      General: He is not in acute distress.    Appearance: He is well-developed.  HENT:     Head: Normocephalic and atraumatic.  Eyes:     Conjunctiva/sclera: Conjunctivae normal.  Cardiovascular:     Rate and Rhythm: Normal rate and regular rhythm.     Heart sounds: No murmur heard. Pulmonary:     Effort: Pulmonary effort is normal. No respiratory distress.     Breath sounds: Normal breath sounds.  Abdominal:     Palpations: Abdomen is soft.     Tenderness: There is abdominal tenderness. There is no guarding.  Musculoskeletal:        General: No swelling.     Cervical back: Neck supple.  Skin:    General: Skin is warm and dry.     Capillary Refill: Capillary refill takes less than 2 seconds.  Neurological:     Mental Status: He is alert.  Psychiatric:        Mood and Affect: Mood normal.      ED Course/ Medical Decision Making/ A&P    Procedures Procedures   Medications Ordered in ED Medications  lactated ringers bolus 2,000 mL (2,000 mLs Intravenous New Bag/Given 09/02/23 2050)  cefTRIAXone (ROCEPHIN) 2 g in sodium chloride 0.9 % 100 mL IVPB (2 g Intravenous New Bag/Given 09/02/23 2008)  iohexol (OMNIPAQUE) 350 MG/ML injection 100 mL (100 mLs Intravenous Contrast Given 09/02/23 2032)  acetaminophen (TYLENOL) tablet 1,000 mg (1,000 mg Oral Given 09/02/23 2209)    Medical Decision Making:    Edwin Odonnell is a 62 y.o. male who presented to the ED today with a concerning history with multiple symptoms detailed above detailed above.     Additional history discussed with patient's family/caregivers.  Patient placed on continuous vitals and telemetry monitoring while in ED which was reviewed periodically.   Complete initial physical exam performed, notably the patient  was hemodynamically stable no acute distress.  He has very mild abdominal tenderness without any focal guarding..      Reviewed and confirmed  nursing documentation for past medical history, family history, social history.    Initial Assessment:   This is most consistent with an acute life/limb threatening illness complicated by underlying chronic conditions. Concerning patient syncope, his description of a prodrome is most consistent with orthostatic versus vasovagal episode. I believe this was likely triggered from an ongoing infection.  Broad underlying evaluation for infectious sources was initiated including evaluation for urinary tract infection, viral etiology, pulmonary source.  Additionally with his history of aortic aneurysm, CTA was performed to evaluate for aortic dissection. This would also evaluate for any other potential etiologies of patient's fever.  Initial Study Results:   Laboratory  All laboratory results reviewed without evidence of clinically relevant pathology.   Exceptions: Leukocytosis, bacteria, nitrite+  EKG EKG was reviewed independently. Rate, rhythm, axis, intervals all examined and without medically relevant abnormality. ST segments without concerns for elevations.    Radiology  All images reviewed independently. Agree with radiology report at this time.   CT Angio Chest/Abd/Pel for Dissection W and/or W/WO  Result Date: 09/02/2023 CLINICAL DATA:  Acute aortic syndrome (AAS) suspected shortness of breath, cough, body aches, fever the last three days. Today he started having chest pain and then had a syncopal episode at [employer]. He was sitting in a chair so he did not fall EXAM: CT ANGIOGRAPHY CHEST, ABDOMEN AND PELVIS TECHNIQUE: Non-contrast CT of the chest was initially obtained. Multidetector CT imaging through the chest, abdomen and pelvis was performed using the standard protocol during bolus administration of intravenous contrast. Multiplanar reconstructed images and MIPs were obtained and reviewed to evaluate the vascular anatomy. RADIATION DOSE REDUCTION: This exam was performed according to  the departmental dose-optimization program which includes automated exposure control, adjustment of the mA and/or kV according to patient size and/or use of iterative reconstruction technique. CONTRAST:  OMNIPAQUE IOHEXOL 350 MG/ML SOLN COMPARISON:  None Available. FINDINGS: CTA CHEST FINDINGS Cardiovascular: Preferential opacification of the thoracic aorta. No evidence of thoracic aortic aneurysm or dissection. Normal heart size. No significant pericardial effusion. No atherosclerotic plaque of the thoracic aorta. No coronary artery calcifications. The main pulmonary artery is normal in caliber. No pulmonary embolus. Mediastinum/Nodes: No enlarged mediastinal, hilar, or axillary lymph nodes. Thyroid gland, trachea, and esophagus demonstrate no significant findings. Lungs/Pleura: No focal consolidation. A 7 mm left upper lobe ground-glass pulmonary nodule (303:15). Solid micronodule within the right middle lobe (303:58). No pulmonary mass. No pleural effusion. No pneumothorax. Musculoskeletal: No chest wall abnormality.  Bilateral gynecomastia. No suspicious lytic or blastic osseous lesions. No acute displaced fracture. Multilevel degenerative changes of the spine. Review of the MIP images confirms the above findings. CTA ABDOMEN AND PELVIS FINDINGS VASCULAR Aorta: Mild atherosclerotic plaque. Normal caliber aorta without aneurysm, dissection, vasculitis or significant stenosis. Celiac: Mild atherosclerotic plaque. Patent without evidence of aneurysm, dissection, vasculitis or significant stenosis. SMA: Patent without evidence  of aneurysm, dissection, vasculitis or significant stenosis. Renals: Both renal arteries are patent without evidence of aneurysm, dissection, vasculitis, fibromuscular dysplasia or significant stenosis. IMA: Patent without evidence of aneurysm, dissection, vasculitis or significant stenosis. Inflow: Patent without evidence of aneurysm, dissection, vasculitis or significant stenosis.  Veins: No obvious venous abnormality within the limitations of this arterial phase study. Review of the MIP images confirms the above findings. NON-VASCULAR Hepatobiliary: No focal liver abnormality. No gallstones, gallbladder wall thickening, or pericholecystic fluid. No biliary dilatation. Pancreas: No focal lesion. Normal pancreatic contour. No surrounding inflammatory changes. No main pancreatic ductal dilatation. Spleen: Normal in size without focal abnormality.  Fluid Adrenals/Urinary Tract: Hyperplastic bilateral adrenal glands with no adrenal nodule bilaterally. Bilateral kidneys enhance symmetrically. Density lesion within left kidney likely represents a simple renal cyst. Simple renal cysts, in the absence of clinically indicated signs/symptoms, require no independent follow-up. No hydronephrosis. No hydroureter. The urinary bladder is unremarkable. Stomach/Bowel: Stomach is within normal limits. No evidence of bowel wall thickening or dilatation. Colonic diverticulosis. No pneumatosis. Appendix appears normal. Lymphatic: No lymphadenopathy. Reproductive: Prostate is enlarged measured 5.5 cm. Other: No intraperitoneal free fluid. No intraperitoneal free gas. No organized fluid collection. Musculoskeletal: Small umbilical Richter hernia containing a short segment of anti mesenteric wall of the small bowel appear. No suspicious lytic or blastic osseous lesions. No acute displaced fracture. Multilevel degenerative changes of the spine. Review of the MIP images confirms the above findings. IMPRESSION: 1. No acute aortic abnormality. 2. No pulmonary embolus. 3.  Aortic Atherosclerosis (ICD10-I70.0)-mild. 4. Multiple pulmonary nodules. Most significant: Left ground-glass pulmonary nodule within the upper lobe measuring 7 mm. Per Fleischner Society Guidelines, recommend a non-contrast Chest CT at 6-12 months to confirm persistence, then additional non-contrast Chest CTs every 2 years until 5 years. If nodule  grows or develops solid component(s), consider resection. These guidelines do not apply to immunocompromised patients and patients with cancer. Follow up in patients with significant comorbidities as clinically warranted. For lung cancer screening, adhere to Lung-RADS guidelines. Reference: Radiology. 2017; 284(1):228-43. 5. Prostatomegaly. 6. Colonic diverticulosis with no acute diverticulitis. 7. Small umbilical Richter hernia containing a short segment of small bowel. No findings of associated bowel obstruction or suggestion of ischemia. Electronically Signed   By: Tish Frederickson M.D.   On: 09/02/2023 21:25   DG Chest Port 1 View  Result Date: 09/02/2023 CLINICAL DATA:  Fever EXAM: PORTABLE CHEST 1 VIEW COMPARISON:  11/14/2022 FINDINGS: The heart size and mediastinal contours are within normal limits. Both lungs are clear. The visualized skeletal structures are unremarkable. IMPRESSION: No active disease. Electronically Signed   By: Jasmine Pang M.D.   On: 09/02/2023 18:54     Reassessment and Plan:   CTA was fortunately benign. He does have chest nodules which she was informed and written and verbal format. His objective findings are most concerning for urinary tract infection.  No lactic acidosis and vital signs improved after IV fluids and Rocephin.  Will continue treatment third-generation cephalosporin due to his history of complex infection while pending urine culture results for narrowing of his antibiotics. He is ambulatory tolerating p.o. intake and feels improved outpatient care and management. Given well appearance I also feel comfortable with this with strict return precautions reinforced.   Disposition:  I have considered need for hospitalization, however, considering all of the above, I believe this patient is stable for discharge at this time.  Patient/family educated about specific return precautions for given chief complaint and symptoms.  Patient/family educated  about follow-up  with PCP .     Patient/family expressed understanding of return precautions and need for follow-up. Patient spoken to regarding all imaging and laboratory results and appropriate follow up for these results. All education provided in verbal form with additional information in written form. Time was allowed for answering of patient questions. Patient discharged.    Emergency Department Medication Summary:   Medications  lactated ringers bolus 2,000 mL (2,000 mLs Intravenous New Bag/Given 09/02/23 2050)  cefTRIAXone (ROCEPHIN) 2 g in sodium chloride 0.9 % 100 mL IVPB (2 g Intravenous New Bag/Given 09/02/23 2008)  iohexol (OMNIPAQUE) 350 MG/ML injection 100 mL (100 mLs Intravenous Contrast Given 09/02/23 2032)  acetaminophen (TYLENOL) tablet 1,000 mg (1,000 mg Oral Given 09/02/23 2209)         Clinical Impression:  1. Fever, unspecified fever cause   2. Urinary tract infection without hematuria, site unspecified      Discharge   Final Clinical Impression(s) / ED Diagnoses Final diagnoses:  Fever, unspecified fever cause  Urinary tract infection without hematuria, site unspecified    Rx / DC Orders ED Discharge Orders          Ordered    cefdinir (OMNICEF) 300 MG capsule  2 times daily        09/02/23 2218              Glyn Ade, MD 09/02/23 2220

## 2023-09-02 NOTE — Discharge Instructions (Addendum)
Multiple pulmonary nodules. Most significant: Left ground-glass  pulmonary nodule within the upper lobe measuring 7 mm. Per  Fleischner Society Guidelines, recommend a non-contrast Chest CT at  6-12 months to confirm persistence, then additional non-contrast  Chest CTs every 2 years until 5 years. If nodule grows or develops  solid component(s), consider resection. These guidelines do not  apply to immunocompromised patients and patients with cancer. Follow  up in patients with significant comorbidities as clinically  warranted. For lung cancer screening, adhere to Lung-RADS  guidelines. Reference: Radiology. 2017; 284(1):228-43.

## 2023-09-02 NOTE — ED Notes (Signed)
Patient transported to CT 

## 2023-09-05 LAB — URINE CULTURE: Culture: >=100000 — AB

## 2023-09-06 ENCOUNTER — Telehealth (HOSPITAL_BASED_OUTPATIENT_CLINIC_OR_DEPARTMENT_OTHER): Payer: Self-pay | Admitting: *Deleted

## 2023-09-06 LAB — CULTURE, BLOOD (ROUTINE X 2)
Culture: NO GROWTH
Culture: NO GROWTH
Special Requests: ADEQUATE
Special Requests: ADEQUATE

## 2023-09-06 NOTE — Telephone Encounter (Signed)
Post ED Visit - Positive Culture Follow-up  Culture report reviewed by antimicrobial stewardship pharmacist: [facility] []  Enzo Bi, Pharm.D. []  Celedonio Miyamoto, Pharm.D., BCPS AQ-ID []  Garvin Fila, Pharm.D., BCPS []  Georgina Pillion, Pharm.D., BCPS []  [place], [address].D., BCPS, AAHIVP []  Estella Husk, Pharm.D., BCPS, AAHIVP [x]  Lysle Pearl, PharmD, BCPS []  Phillips Climes, PharmD, BCPS []  Agapito Games, PharmD, BCPS []  Verlan Friends, PharmD []  Mervyn Gay, PharmD, BCPS []  Vinnie Level, PharmD  [facility] []  Len Childs, PharmD []  Greer Pickerel, PharmD []  Adalberto Cole, PharmD []  Perlie Gold, Rph []  Lonell Face) Jean Rosenthal, PharmD []  Earl Many, PharmD []  Junita Push, PharmD []  Dorna Leitz, PharmD []  Terrilee Files, PharmD []  Lynann Beaver, PharmD []  Keturah Barre, PharmD []  Loralee Pacas, PharmD []  Bernadene Person, PharmD   Positive urine culture Treated with cefdinir, organism sensitive to the same and no further patient follow-up is required at this time.  Bing Quarry 09/06/2023, 3:53 PM

## 2024-12-31 ENCOUNTER — Emergency Department (HOSPITAL_BASED_OUTPATIENT_CLINIC_OR_DEPARTMENT_OTHER): Payer: MEDICAID

## 2024-12-31 ENCOUNTER — Other Ambulatory Visit: Payer: Self-pay

## 2024-12-31 ENCOUNTER — Encounter (HOSPITAL_BASED_OUTPATIENT_CLINIC_OR_DEPARTMENT_OTHER): Payer: Self-pay | Admitting: Emergency Medicine

## 2024-12-31 ENCOUNTER — Emergency Department (HOSPITAL_BASED_OUTPATIENT_CLINIC_OR_DEPARTMENT_OTHER)
Admission: EM | Admit: 2024-12-31 | Discharge: 2024-12-31 | Disposition: A | Discharge status: Home / Self Care | Payer: MEDICAID | Attending: Emergency Medicine | Admitting: Emergency Medicine

## 2024-12-31 DIAGNOSIS — R079 Chest pain, unspecified: Secondary | ICD-10-CM

## 2024-12-31 DIAGNOSIS — Z79899 Other long term (current) drug therapy: Secondary | ICD-10-CM | POA: Insufficient documentation

## 2024-12-31 DIAGNOSIS — R0789 Other chest pain: Secondary | ICD-10-CM | POA: Insufficient documentation

## 2024-12-31 DIAGNOSIS — I10 Essential (primary) hypertension: Secondary | ICD-10-CM | POA: Diagnosis not present

## 2024-12-31 LAB — BASIC METABOLIC PANEL WITH GFR
Anion gap: 12 (ref 5–15)
BUN: 19 mg/dL (ref 8–23)
CO2: 24 mmol/L (ref 22–32)
Calcium: 10.5 mg/dL — ABNORMAL HIGH (ref 8.9–10.3)
Chloride: 108 mmol/L (ref 98–111)
Creatinine, Ser: 0.92 mg/dL (ref 0.61–1.24)
GFR, Estimated: >60 mL/min
Glucose, Bld: 89 mg/dL (ref 70–99)
Potassium: 3.9 mmol/L (ref 3.5–5.1)
Sodium: 143 mmol/L (ref 135–145)

## 2024-12-31 LAB — CBC
HCT: 41.7 % (ref 39.0–52.0)
Hemoglobin: 14.4 g/dL (ref 13.0–17.0)
MCH: 29.8 pg (ref 26.0–34.0)
MCHC: 34.5 g/dL (ref 30.0–36.0)
MCV: 86.2 fL (ref 80.0–100.0)
Platelets: 216 K/uL (ref 150–400)
RBC: 4.84 MIL/uL (ref 4.22–5.81)
RDW: 13.3 % (ref 11.5–15.5)
WBC: 7.4 K/uL (ref 4.0–10.5)
nRBC: 0 % (ref 0.0–0.2)

## 2024-12-31 LAB — TROPONIN T, HIGH SENSITIVITY
Troponin T High Sensitivity: 14 ng/L (ref 0–19)
Troponin T High Sensitivity: 13 ng/L (ref 0–19)

## 2024-12-31 MED ORDER — IOHEXOL 350 MG/ML SOLN
125.0000 mL | Freq: Once | INTRAVENOUS | Status: AC | PRN
  Administered 2024-12-31: 125 mL via INTRAVENOUS

## 2024-12-31 MED ORDER — ACETAMINOPHEN 500 MG PO TABS
1000.0000 mg | ORAL_TABLET | Freq: Once | ORAL | Status: AC
  Administered 2024-12-31: 1000 mg via ORAL
  Filled 2024-12-31: qty 2

## 2024-12-31 NOTE — ED Notes (Signed)
Dr. Belfi at bedside 

## 2024-12-31 NOTE — Discharge Instructions (Addendum)
 Make an appointment to follow-up with a cardiologist.  Return to the emergency room if you have any worsening symptoms.

## 2024-12-31 NOTE — ED Provider Notes (Signed)
 " [facility] Provider Note   CSN: [id number] Arrival date & time: 12/31/24  8040     Patient presents with: Headache and Chest Pain   Edwin Odonnell is a 64 y.o. male.   Patient is a 64 year old who presents with chest pain.  He describes chest pain that started about 3:00 this afternoon.  He says its across his chest.  He has some shortness of breath but he says that is baseline for him and unchanged from his baseline.  No cough or cold symptoms.  No nausea or vomiting.  He reports some right-sided abdominal pain but he said that is also chronic for him and has been followed by his primary care doctor.  He has chronic headaches which he is followed by a neurologist and that is also unchanged.  The only new symptom today is his chest pain.  No known history of prior heart disease.  No leg pain or swelling other than his arthritis pains.       Prior to Admission medications  Medication Sig Start Date End Date Taking? Authorizing Provider  allopurinol (ZYLOPRIM) 100 MG tablet Take 100 mg by mouth daily.    [provider]  amLODipine  (NORVASC ) 10 MG tablet Take 1 tablet (10 mg total) by mouth daily. 11/14/22   Ruthell Lonni FALCON, PA-C  benzonatate  (TESSALON ) 100 MG capsule Take 1 capsule (100 mg total) by mouth every 8 (eight) hours. 10/11/21   Caccavale, Sophia, PA-C  famotidine  (PEPCID ) 20 MG tablet Take 1 tablet (20 mg total) by mouth daily for 7 days. 10/11/21 10/18/21  Caccavale, Sophia, PA-C  hydrochlorothiazide (HYDRODIURIL) 25 MG tablet Take 25 mg by mouth daily.    [provider]  losartan (COZAAR) 100 MG tablet Take 100 mg by mouth daily. 10/13/20   [provider]  meloxicam (MOBIC) 15 MG tablet Take 15 mg by mouth daily as needed. 10/13/20   [provider]  ondansetron  (ZOFRAN  ODT) 4 MG disintegrating tablet Take 1 tablet (4 mg total) by mouth every 8 (eight) hours as needed for nausea or vomiting.  10/11/21   Caccavale, Sophia, PA-C  potassium chloride  (KLOR-CON ) 10 MEQ tablet Take by mouth. 10/28/20 11/04/20  [provider]  PROZAC 10 MG capsule Take 10 mg by mouth 3 (three) times daily. 10/13/20   [provider]  traZODone (DESYREL) 100 MG tablet Take 100 mg by mouth daily. 10/13/20   [provider]    Allergies: Patient has no known allergies.    Review of Systems  Constitutional:  Negative for chills, diaphoresis, fatigue and fever.  HENT:  Negative for congestion, rhinorrhea and sneezing.   Eyes: Negative.   Respiratory:  Negative for cough and shortness of breath.   Cardiovascular:  Positive for chest pain. Negative for leg swelling.  Gastrointestinal:  Positive for abdominal pain (Chronic right side abdominal pain). Negative for diarrhea, nausea and vomiting.  Genitourinary:  Negative for difficulty urinating, flank pain, frequency and hematuria.  Musculoskeletal:  Positive for arthralgias (Chronic). Negative for back pain.  Skin:  Negative for rash.  Neurological:  Positive for headaches (Chronic). Negative for dizziness, speech difficulty, weakness and numbness.    Updated Vital Signs BP 122/85   Pulse 77   Temp 97.8 F (36.6 C)   Resp 17   Ht 6' 1 (1.854 m)   Wt 113.4 kg   SpO2 95%   BMI 32.98 kg/m   Physical Exam Constitutional:  Appearance: He is well-developed.  HENT:     Head: Normocephalic and atraumatic.  Eyes:     Pupils: Pupils are equal, round, and reactive to light.  Cardiovascular:     Rate and Rhythm: Normal rate and regular rhythm.     Heart sounds: Normal heart sounds.  Pulmonary:     Effort: Pulmonary effort is normal. No respiratory distress.     Breath sounds: Normal breath sounds. No wheezing or rales.  Chest:     Chest wall: Tenderness (Positive tenderness across the chest wall, primarily overlying the sternum, no crepitus or deformity) present.  Abdominal:     General: Bowel sounds are normal.      Palpations: Abdomen is soft.     Tenderness: There is no abdominal tenderness. There is no guarding or rebound.  Musculoskeletal:        General: Normal range of motion.     Cervical back: Normal range of motion and neck supple.     Comments: No edema or calf tenderness  Lymphadenopathy:     Cervical: No cervical adenopathy.  Skin:    General: Skin is warm and dry.     Findings: No rash.  Neurological:     Mental Status: He is alert and oriented to person, place, and time.     (all labs ordered are listed, but only abnormal results are displayed) Labs Reviewed  BASIC METABOLIC PANEL WITH GFR - Abnormal; Notable for the following components:      Result Value   Calcium 10.5 (*)    All other components within normal limits  CBC  TROPONIN T, HIGH SENSITIVITY  TROPONIN T, HIGH SENSITIVITY    EKG: EKG Interpretation Date/Time:  Wednesday December 31 2024 20:05:55 EST Ventricular Rate:  73 PR Interval:  181 QRS Duration:  96 QT Interval:  388 QTC Calculation: 428 R Axis:   47  Text Interpretation: Sinus rhythm Ventricular premature complex Borderline ST elevation, anterolateral leads Confirmed by Lenor Hollering [id number]) on 12/31/2024 9:08:52 PM  Radiology: CT Angio Chest Aorta w/CM &/OR wo/CM Result Date: 12/31/2024 EXAM: CTA CHEST AORTA 12/31/2024 10:27:36 PM TECHNIQUE: CTA of the chest was performed without and with the administration of 125 mL of intravenous contrast (iohexol  (OMNIPAQUE ) 350 MG/ML injection 125 mL IOHEXOL  350 MG/ML SOLN). Multiplanar reformatted images are provided for review. MIP images are provided for review. Automated exposure control, iterative reconstruction, and/or weight based adjustment of the mA/kV was utilized to reduce the radiation dose to as low as reasonably achievable. COMPARISON: 09/02/2023 CLINICAL HISTORY: Acute aortic syndrome (AAS) suspected. FINDINGS: AORTA: No thoracic aortic dissection. No aneurysm. MEDIASTINUM: Mild cardiomegaly. Mild  coronary atherosclerosis of the LAD. No mediastinal lymphadenopathy. LYMPH NODES: No mediastinal, hilar or axillary lymphadenopathy. LUNGS AND PLEURA: Mildly dependent atelectasis in the bilateral lower lobes. No pulmonary embolism. No focal consolidation or pulmonary edema. No pleural effusion or pneumothorax. UPPER ABDOMEN: Limited images of the upper abdomen are unremarkable. SOFT TISSUES AND BONES: Mild degenerative changes of the lower thoracic spine. No acute soft tissue abnormality. IMPRESSION: 1. No evidence of acute aortic syndrome. 2. No pulmonary embolism. 3. Negative CT chest. Electronically signed by: Pinkie Pebbles MD 12/31/2024 10:30 PM EST RP Workstation: [id number]   DG Chest 2 View Result Date: 12/31/2024 EXAM: 2 VIEW(S) XRAY OF THE CHEST 12/31/2024 08:21:00 PM COMPARISON: 09/02/2023 CLINICAL HISTORY: chest pain FINDINGS: LUNGS AND PLEURA: No focal pulmonary opacity. No pleural effusion. No pneumothorax. HEART AND MEDIASTINUM: No acute abnormality of the cardiac and mediastinal silhouettes.  BONES AND SOFT TISSUES: No acute osseous abnormality. IMPRESSION: 1. No acute cardiopulmonary abnormality. Electronically signed by: Pinkie Pebbles MD 12/31/2024 08:32 PM EST RP Workstation: [id number]     Procedures   Medications Ordered in the ED  acetaminophen  (TYLENOL ) tablet 1,000 mg (has no administration in time range)  iohexol  (OMNIPAQUE ) 350 MG/ML injection 125 mL (125 mLs Intravenous Contrast Given 12/31/24 2218)                                    Medical Decision Making Amount and/or Complexity of Data Reviewed Labs: ordered. Radiology: ordered.  Risk OTC drugs. Prescription drug management.   This patient presents to the ED for concern of chest pain, this involves an extensive number of treatment options, and is a complaint that carries with it a high risk of complications and morbidity.  I considered the following differential and admission for this acute, potentially  life threatening condition.  The differential diagnosis includes ACS, aortic dissection, musculoskeletal pain, GERD, PE, pneumonia, pneumothorax, pericarditis  MDM:    Patient is a 64 year old who presents with chest pain.  It is fairly atypical.  It is across his chest.  Is not exertional.  Nonpleuritic.  It is worse with palpation of the chest wall, particularly over the sternum.  There is no rashes or signs of external trauma.  His EKG shows some slight ST elevation laterally which is little different than his old EKG although there is no definite reciprocal changes.  He has had 2 negative troponins.  CTA of his chest does not show any evidence of aortic dissection or PE.  He was given a dose of Tylenol .  He does not have any associated symptoms.  He is overall well-appearing.  He was discharged home in good condition.  He was given information about following up with cardiology.  Was advised to call tomorrow for an appointment.  Return precautions were given.  (Labs, imaging, consults)  Labs: I Ordered, and personally interpreted labs.  The pertinent results include: Normal troponins, no anemia, electrolytes nonconcerning  Imaging Studies ordered: I ordered imaging studies including chest x-ray, CTA chest I independently visualized and interpreted imaging. I agree with the radiologist interpretation  Additional history obtained from  .  External records from outside source obtained and reviewed including prior notes  Cardiac Monitoring: The patient was maintained on a cardiac monitor.  If on the cardiac monitor, I personally viewed and interpreted the cardiac monitored which showed an underlying rhythm of: Sinus rhythm  Reevaluation: After the interventions noted above, I reevaluated the patient and found that they have :improved  Social Determinants of Health:    Disposition: Discharged to home  Co morbidities that complicate the patient evaluation  Past Medical History:   Diagnosis Date   Arthritis    Environmental allergies    Gout    High cholesterol    Hypertension      Medicines Meds ordered this encounter  Medications   iohexol  (OMNIPAQUE ) 350 MG/ML injection 125 mL   acetaminophen  (TYLENOL ) tablet 1,000 mg    I have reviewed the patients home medicines and have made adjustments as needed  Problem List / ED Course: Problem List Items Addressed This Visit   None Visit Diagnoses       Nonspecific chest pain    -  Primary                Final  diagnoses:  Nonspecific chest pain    ED Discharge Orders     None          Lenor Hollering, MD 12/31/24 2309  "

## 2024-12-31 NOTE — ED Notes (Signed)
 ..  The patient is A&OX4, ambulatory at d/c with independent steady gait, NAD. Pt verbalized understanding of d/c instructions and follow up care.

## 2024-12-31 NOTE — ED Triage Notes (Signed)
 Pt c/o headache for a while, chest tightness since appx 1500 today, R shoulder numbness intermittent x 3 days.
